# Patient Record
Sex: Male | Born: 1945 | Race: Asian | Hispanic: No | Marital: Married | Smoking: Former smoker
Health system: Southern US, Community
[De-identification: ages and names within clinical notes are randomized; demographics above are authoritative.]

## PROBLEM LIST (undated history)

## (undated) DIAGNOSIS — R4182 Altered mental status, unspecified: Secondary | ICD-10-CM

## (undated) DIAGNOSIS — J810 Acute pulmonary edema: Secondary | ICD-10-CM

## (undated) HISTORY — DX: Altered mental status, unspecified: R41.82

## (undated) HISTORY — DX: Acute pulmonary edema: J81.0

---

## 1998-12-07 ENCOUNTER — Observation Stay (HOSPITAL_COMMUNITY): Admission: EM | Admit: 1998-12-07 | Discharge: 1998-12-08 | Payer: Self-pay | Admitting: Emergency Medicine

## 1998-12-07 ENCOUNTER — Encounter: Payer: Self-pay | Admitting: *Deleted

## 2009-01-24 ENCOUNTER — Emergency Department (HOSPITAL_COMMUNITY): Admission: EM | Admit: 2009-01-24 | Discharge: 2009-01-24 | Payer: Self-pay | Admitting: Family Medicine

## 2010-07-29 LAB — CBC
HCT: 36.1 % — ABNORMAL LOW (ref 39.0–52.0)
Hemoglobin: 11.9 g/dL — ABNORMAL LOW (ref 13.0–17.0)
MCHC: 33.1 g/dL (ref 30.0–36.0)
MCV: 80.3 fL (ref 78.0–100.0)
Platelets: 175 10*3/uL (ref 150–400)
RBC: 4.49 MIL/uL (ref 4.22–5.81)
RDW: 13.7 % (ref 11.5–15.5)
WBC: 8.8 10*3/uL (ref 4.0–10.5)

## 2010-07-29 LAB — POCT I-STAT, CHEM 8
Calcium, Ion: 1.09 mmol/L — ABNORMAL LOW (ref 1.12–1.32)
HCT: 39 % (ref 39.0–52.0)
TCO2: 25 mmol/L (ref 0–100)

## 2010-07-29 LAB — DIFFERENTIAL
Basophils Absolute: 0 10*3/uL (ref 0.0–0.1)
Basophils Relative: 0 % (ref 0–1)
Eosinophils Absolute: 0.1 10*3/uL (ref 0.0–0.7)
Eosinophils Relative: 1 % (ref 0–5)
Lymphocytes Relative: 14 % (ref 12–46)
Lymphs Abs: 1.2 10*3/uL (ref 0.7–4.0)
Monocytes Absolute: 0.8 10*3/uL (ref 0.1–1.0)
Monocytes Relative: 9 % (ref 3–12)
Neutro Abs: 6.6 10*3/uL (ref 1.7–7.7)
Neutrophils Relative %: 75 % (ref 43–77)

## 2010-07-29 LAB — POCT URINALYSIS DIP (DEVICE)
Nitrite: NEGATIVE
Protein, ur: NEGATIVE mg/dL
pH: 5 (ref 5.0–8.0)

## 2011-08-31 ENCOUNTER — Emergency Department (INDEPENDENT_AMBULATORY_CARE_PROVIDER_SITE_OTHER)
Admission: EM | Admit: 2011-08-31 | Discharge: 2011-08-31 | Disposition: A | Discharge status: Other Acute Inpatient Hospital | Payer: Commercial Managed Care - PPO | Source: Home / Self Care | Attending: Emergency Medicine | Admitting: Emergency Medicine

## 2011-08-31 ENCOUNTER — Encounter (HOSPITAL_COMMUNITY): Payer: Self-pay | Admitting: *Deleted

## 2011-08-31 ENCOUNTER — Encounter (HOSPITAL_COMMUNITY): Payer: Self-pay

## 2011-08-31 ENCOUNTER — Emergency Department (INDEPENDENT_AMBULATORY_CARE_PROVIDER_SITE_OTHER): Payer: Commercial Managed Care - PPO

## 2011-08-31 ENCOUNTER — Emergency Department (HOSPITAL_COMMUNITY): Payer: Commercial Managed Care - PPO

## 2011-08-31 ENCOUNTER — Emergency Department (HOSPITAL_COMMUNITY)
Admission: EM | Admit: 2011-08-31 | Discharge: 2011-08-31 | Disposition: A | Discharge status: Home / Self Care | Payer: Commercial Managed Care - PPO | Attending: Emergency Medicine | Admitting: Emergency Medicine

## 2011-08-31 DIAGNOSIS — R918 Other nonspecific abnormal finding of lung field: Secondary | ICD-10-CM

## 2011-08-31 DIAGNOSIS — R911 Solitary pulmonary nodule: Secondary | ICD-10-CM

## 2011-08-31 DIAGNOSIS — J449 Chronic obstructive pulmonary disease, unspecified: Secondary | ICD-10-CM | POA: Insufficient documentation

## 2011-08-31 DIAGNOSIS — R222 Localized swelling, mass and lump, trunk: Secondary | ICD-10-CM

## 2011-08-31 DIAGNOSIS — M545 Low back pain, unspecified: Secondary | ICD-10-CM | POA: Insufficient documentation

## 2011-08-31 DIAGNOSIS — J4489 Other specified chronic obstructive pulmonary disease: Secondary | ICD-10-CM | POA: Insufficient documentation

## 2011-08-31 DIAGNOSIS — J984 Other disorders of lung: Secondary | ICD-10-CM | POA: Insufficient documentation

## 2011-08-31 DIAGNOSIS — F17200 Nicotine dependence, unspecified, uncomplicated: Secondary | ICD-10-CM | POA: Insufficient documentation

## 2011-08-31 LAB — URINALYSIS, ROUTINE W REFLEX MICROSCOPIC
Bilirubin Urine: NEGATIVE
Glucose, UA: NEGATIVE mg/dL
Hgb urine dipstick: NEGATIVE
Specific Gravity, Urine: 1.02 (ref 1.005–1.030)
pH: 5.5 (ref 5.0–8.0)

## 2011-08-31 LAB — DIFFERENTIAL
Basophils Absolute: 0 10*3/uL (ref 0.0–0.1)
Basophils Relative: 0 % (ref 0–1)
Eosinophils Relative: 2 % (ref 0–5)
Lymphocytes Relative: 19 % (ref 12–46)
Neutro Abs: 5.6 10*3/uL (ref 1.7–7.7)

## 2011-08-31 LAB — COMPREHENSIVE METABOLIC PANEL
ALT: 17 U/L (ref 0–53)
AST: 22 U/L (ref 0–37)
Albumin: 3.5 g/dL (ref 3.5–5.2)
CO2: 23 mEq/L (ref 19–32)
Calcium: 9 mg/dL (ref 8.4–10.5)
GFR calc non Af Amer: >90 mL/min (ref >90)
Sodium: 137 mEq/L (ref 135–145)
Total Protein: 7.1 g/dL (ref 6.0–8.3)

## 2011-08-31 LAB — CBC
MCHC: 32.8 g/dL (ref 30.0–36.0)
MCV: 76 fL — ABNORMAL LOW (ref 78.0–100.0)
Platelets: 153 10*3/uL (ref 150–400)
RDW: 13 % (ref 11.5–15.5)
WBC: 8 10*3/uL (ref 4.0–10.5)

## 2011-08-31 MED ORDER — AZITHROMYCIN 250 MG PO TABS: 250.0000 mg | ORAL_TABLET | Freq: Every day | ORAL | Status: AC

## 2011-08-31 MED ORDER — HYDROCODONE-ACETAMINOPHEN 5-325 MG PO TABS: ORAL_TABLET | ORAL | Status: AC

## 2011-08-31 MED ORDER — HYDROCODONE-ACETAMINOPHEN 5-325 MG PO TABS
1.0000 | ORAL_TABLET | Freq: Once | ORAL | Status: AC
  Administered 2011-08-31: 1 via ORAL
  Filled 2011-08-31: qty 1

## 2011-08-31 MED ORDER — IOHEXOL 300 MG/ML  SOLN
80.0000 mL | Freq: Once | INTRAMUSCULAR | Status: AC | PRN
  Administered 2011-08-31: 80 mL via INTRAVENOUS

## 2011-08-31 MED ORDER — AZITHROMYCIN 250 MG PO TABS
500.0000 mg | ORAL_TABLET | Freq: Once | ORAL | Status: AC
  Administered 2011-08-31: 500 mg via ORAL
  Filled 2011-08-31: qty 2

## 2011-08-31 NOTE — Discharge Instructions (Signed)
Please read and follow all provided instructions.  Your diagnoses today include:  1. Lung nodule     Tests performed today include:  Blood counts and electrolytes - normal  CT scan of lungs - shows a nodule that will need follow up with a lung doctor  Vital signs. See below for your results today.   Medications prescribed:   Vicodin (hydrocodone/acetaminophen) - narcotic pain medication  You have been prescribed narcotic pain medication such as Vicodin or Percocet: DO NOT drive or perform any activities that require you to be awake and alert because this medicine can make you drowsy. BE VERY CAREFUL not to take multiple medicines containing Tylenol (also called acetaminophen). Doing so can lead to an overdose which can damage your liver and cause liver failure and possibly death.    Zithromax - antibiotic that treats pneumonia  You have been prescribed an antibiotic medicine: take the entire course of medicine even if you are feeling better. Stopping early can cause the antibiotic not to work.  Take any prescribed medications only as directed.  Home care instructions:  Follow any educational materials contained in this packet.  BE VERY CAREFUL not to take multiple medicines containing Tylenol (also called acetaminophen). Doing so can lead to an overdose which can damage your liver and cause liver failure and possibly death.   Follow-up instructions: Call lung doctor tomorrow to schedule an appointment. Tell them that you were in the Emergency Department, you had a CT scan performed and need a follow-up appointment.   Please follow-up with your primary care provider in the next 3 days for further evaluation of your symptoms.   If you do not have a primary care doctor -- see below for referral information.   Return instructions:   Please return to the Emergency Department if you experience worsening symptoms.   Please return if you have any other emergent  concerns.  Additional Information:  Your vital signs today were: BP 127/68  Pulse 73  Temp(Src) 97.8 F (36.6 C) (Oral)  Resp 18  SpO2 100% If your blood pressure (BP) was elevated above 135/85 this visit, please have this repeated by your doctor within one month. -------------- No Primary Care Doctor Call [facility]  [phone] Other agencies that provide inexpensive medical care    [facility]  [phone]    [facility]  [phone]    [facility]  [phone]    [facility]  [phone]    [facility]  [phone]    [facility]  [phone] -------------- RESOURCE GUIDE:  Dental Problems  Patients with Medicaid: [facility] [phone] [address]                                            [phone] [address] Phone:  [phone]                                                   Phone:  [phone]  If unable to pay or uninsured, contact:  [facility] or [facility]. to become  qualified for the adult dental clinic.  Chronic Pain Problems Contact [facility]  [phone] Patients need to be referred by their primary care doctor.  Insufficient Money for Medicine Contact [employer]:  call "211" or [facility] [phone].  Psychological Services [facility]  [phone] [facility]  [phone] [facility]   [phone] (emergency services [phone])  Substance Abuse Resources [facility]  [phone] [facility] [phone] [facility] [place] [phone] Floydene Flock [phone] Residential & Outpatient Substance Abuse Program  [phone]  Abuse/Neglect [facility] Child Abuse Hotline [phone] [facility] Child Abuse Hotline [phone] (After Hours)  Emergency Shelter [facility] [phone]  Maternity Homes [facility] [place] [facility]  [facility] [place] [phone] [place] [facility] [phone]  [facility]  [facility]     [employer]                          [facility] [address] [place]                       [address]      [address]  Blondell Reveal Phone:  [phone]                                   Phone:  [phone]                 Phone:  [phone]  [facility] Mental [facility] Phone:  [phone]  [facility] Child Abuse Hotline 904 [phone] (After Hours)

## 2011-08-31 NOTE — ED Provider Notes (Signed)
History     CSN: [id number]  Arrival date & time 08/31/11  1414   First MD Initiated Contact with Patient 08/31/11 1607      Chief Complaint  Patient presents with  . Cough    (Consider location/radiation/quality/duration/timing/severity/associated sxs/prior treatment) HPI Comments: Patient with likely history of COPD and multiple decade smoking history presents from [facility] urgent care center for further evaluation of cough and suspicious nodule found on chest x-ray. History is obtained with help of granddaughter who interprets. Patient speaks limited [place]. Patient has had cough for 3-4 days that is nonproductive. Also complains of bilateral lower back pain for 2 weeks. Patient denies fevers, chills, upper respiratory tract infection symptoms, chest pain, abdominal pain, urinary symptoms.  Patient is a 66 y.o. male presenting with cough. The history is provided by the patient.  Cough This is a new problem. The current episode started more than 2 days ago. The problem has not changed since onset.The cough is non-productive. There has been no fever. Pertinent negatives include no chest pain, no chills, no sweats, no weight loss, no headaches, no rhinorrhea, no sore throat, no myalgias, no shortness of breath, no wheezing and no eye redness. He has tried nothing for the symptoms. He is a smoker. His past medical history is significant for COPD.    History reviewed. No pertinent past medical history.  History reviewed. No pertinent past surgical history.  History reviewed. No pertinent family history.  History  Substance Use Topics  . Smoking status: Current Everyday Smoker  . Smokeless tobacco: Not on file  . Alcohol Use: Yes      Review of Systems  Constitutional: Negative for fever, chills and weight loss.  HENT: Negative for sore throat and rhinorrhea.   Eyes: Negative for redness.  Respiratory: Positive for cough. Negative for shortness of breath and wheezing.     Cardiovascular: Negative for chest pain.  Gastrointestinal: Negative for nausea, vomiting, abdominal pain and diarrhea.  Genitourinary: Negative for dysuria.  Musculoskeletal: Positive for back pain. Negative for myalgias.  Skin: Negative for rash.  Neurological: Negative for headaches.    Allergies  Review of patient's allergies indicates no known allergies.  Home Medications   Current Outpatient Rx  Name Route Sig Dispense Refill  . ACETAMINOPHEN 325 MG PO TABS Oral Take 650 mg by mouth every 6 (six) hours as needed.      BP 131/73  Pulse 78  Temp(Src) 97.8 F (36.6 C) (Oral)  Resp 18  SpO2 99%  Physical Exam  Nursing note and vitals reviewed. Constitutional: He appears well-developed. He appears cachectic.  HENT:  Head: Normocephalic and atraumatic.  Eyes: Conjunctivae are normal. Right eye exhibits no discharge. Left eye exhibits no discharge.  Neck: Normal range of motion. Neck supple.  Cardiovascular: Normal rate, regular rhythm and normal heart sounds.   No murmur heard. Pulmonary/Chest: Effort normal and breath sounds normal. No respiratory distress. He has no wheezes.  Abdominal: Soft. There is no tenderness.  Musculoskeletal: He exhibits no edema and no tenderness.       Cervical back: He exhibits normal range of motion, no tenderness and no bony tenderness.       Thoracic back: He exhibits normal range of motion, no tenderness and no bony tenderness.       Lumbar back: He exhibits normal range of motion, no tenderness and no bony tenderness.  Neurological: He is alert. He has normal strength. Coordination normal.       5/5 strength in  lower extremities  Skin: Skin is warm and dry.  Psychiatric: He has a normal mood and affect.    ED Course  Procedures (including critical care time)  Labs Reviewed  CBC - Abnormal; Notable for the following:    Hemoglobin 12.8 (*)    MCV 76.0 (*)    MCH 25.0 (*)    All other components within normal limits   URINALYSIS, ROUTINE W REFLEX MICROSCOPIC - Abnormal; Notable for the following:    Ketones, ur 15 (*)    All other components within normal limits  DIFFERENTIAL  COMPREHENSIVE METABOLIC PANEL   Dg Chest 2 View  08/31/2011  *RADIOLOGY REPORT*  Clinical Data: Cough  CHEST - 2 VIEW  Comparison: None.  Findings: The lungs are very hyperaerated consistent with severe COPD.  There is a nodular lesion at the left lung base of approximately 3.4 cm worrisome for primary lung malignancy.  Small nodular opacities also are noted in the right mid lung which could represent metastatic lesions.  CT of the chest therefore with IV contrast media is recommended to assess further.  Pleural scarring and/or plaque formation is noted at the right lung base laterally. Mediastinal contours are unremarkable.  The heart is within normal limits in size.  No bony abnormality is seen.  IMPRESSION:  1.  3.4 cm mass-like opacity at the left lung base worrisome for malignancy.  Recommend CT of the chest with IV contrast media. 2.  Small nodular opacities in the right mid lung could represent metastatic lesions. 3.  COPD.  Original Report Authenticated By: Juline Patch, M.D.   Dg Lumbar Spine Complete  08/31/2011  *RADIOLOGY REPORT*  Clinical Data: Cough, low back pain.  LUMBAR SPINE - COMPLETE 4+ VIEW  Comparison: None.  Findings: Disc spaces are maintained.  Mild lateral and anterior spurring.  Normal alignment.  Normal bone mineralization.  No fracture.  SI joints are symmetric and unremarkable.  IMPRESSION: No acute findings.  Original Report Authenticated By: Cyndie Chime, M.D.   Ct Chest W Contrast  08/31/2011  *RADIOLOGY REPORT*  Clinical Data: Cough.  Lung cancer.  Abnormal chest radiograph.  CT CHEST WITH CONTRAST  Technique:  Multidetector CT imaging of the chest was performed following the standard protocol during bolus administration of intravenous contrast.  Contrast: 80mL OMNIPAQUE IOHEXOL 300 MG/ML  SOLN  Comparison:  08/31/2011.  Findings: Severe emphysema is present.  There is no axillary adenopathy.  Coronary artery atherosclerosis.  No pericardial effusion.  Calcified pleural plaques are present in the right lung. Scattered calcified granuloma are present in the right lung. Largest measures 7 mm (image 31 series 3).  Soft tissue density nodules are present in the lateral left lower lobe over the hemidiaphragm near the costophrenic angle.  The largest lesion measures 28 mm AP by 17 mm transverse (image 48 series 3).  Smaller lesion just anterior measures 18 mm AP by 12 mm transverse. On the coronal reconstructed images, these nodules appear to follow the orientation of the bronchovascular bundles, suggesting this may relate to chronic mucoid impaction.  Small areas spiculation are present in the left lung (image 31 series 3).  Many of the other areas away from the nodules appear compatible with scarring.  Incidental visualization of the upper abdomen is within normal limits.  No mediastinal or hilar adenopathy.  No aggressive osseous lesions are identified.  IMPRESSION: 1.  The mass in the left base on prior radiograph corresponds with nodules in the lateral left lower lobe.  The dominant nodule measures 28 mm AP by 17 mm transverse.  Given the underlying pulmonary parenchymal disease, these may represent inflammatory nodules or areas of mucoid impaction however underlying neoplasm/mass lesion is not excluded.  Follow-up PET CT should be considered. 2.  Scattered calcified granulomata. 3.  Severe emphysema. 4. Multiple scattered areas of pulmonary parenchymal scarring. 5.  Atherosclerosis and coronary artery disease. 6.  Calcified right pleural plaque. 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  Old infection is a consideration as well.  Original Report Authenticated By: Andreas Newport, M.D.     1. Lung nodule   2. Lower back pain     4:16 PM Patient seen and examined. Work-up initiated. Previous  findings and chart reviewed.   Vital signs reviewed and are as follows: Filed Vitals:   08/31/11 1427  BP: 131/73  Pulse: 78  Temp: 97.8 F (36.6 C)  Resp: 18   7:00 PM Patient going to CT now.   8:59 PM CT scan reviewed by myself. Discussed with Dr. Estell Harpin. Discussed findings at length with patient and family. Patient and family state they will followup as directed with the lung specialist referral provided. Family assures me that there is no problem doing this and they will call tomorrow.   Urged patient to return with worsening symptoms, shortness of breath, coughing up blood, fever.   Also urged to establish care with primary care physician.  Patient counseled on use of narcotic pain medications. Counseled not to combine these medications with others containing tylenol. Urged not to drink alcohol, drive, or perform any other activities that requires focus while taking these medications. The patient verbalizes understanding and agrees with the plan.  MDM  Lung nodule -- CT scan favors inflammatory process over cancer however cannot exclude cancer. Pulmonary followup will be needed. Patient given appropriate referral. Will treat with pain medicine and antibiotics. Family is reliable. Back pain possibly related to lung findings. Will benefit from PCP follow-up.       Renne Crigler, [place] 08/31/11 2107

## 2011-08-31 NOTE — ED Notes (Signed)
Patient here for evaluation of cough for past 3-4 days; NAD at present; per grandaughter , patient has not been having a productive cough

## 2011-08-31 NOTE — Discharge Instructions (Signed)
TO ED now

## 2011-08-31 NOTE — ED Provider Notes (Signed)
History     CSN: [id number]  Arrival date & time 08/31/11  1130   First MD Initiated Contact with Patient 08/31/11 1227      Chief Complaint  Patient presents with  . Cough    (Consider location/radiation/quality/duration/timing/severity/associated sxs/prior treatment) Patient is a 66 y.o. male presenting with cough. The history is provided by the patient. No language interpreter was used.  Cough This is a new problem. The current episode started more than 1 week ago. The problem occurs constantly. The problem has been gradually worsening. The cough is productive of sputum. There has been no fever. Associated symptoms include myalgias. Pertinent negatives include no shortness of breath. He has tried nothing for the symptoms. He is a smoker.  Pt complains of  A cough and severe pain in his low back.   No past history.  No MD.  Pt reports he has been unable to work due to the pain  History reviewed. No pertinent past medical history.  History reviewed. No pertinent past surgical history.  History reviewed. No pertinent family history.  History  Substance Use Topics  . Smoking status: Current Everyday Smoker  . Smokeless tobacco: Not on file  . Alcohol Use: Yes      Review of Systems  Respiratory: Positive for cough. Negative for shortness of breath.   Musculoskeletal: Positive for myalgias.  All other systems reviewed and are negative.    Allergies  Review of patient's allergies indicates no known allergies.  Home Medications  No current outpatient prescriptions on file.  BP 124/78  Pulse 88  Temp(Src) 98.9 F (37.2 C) (Oral)  Resp 20  SpO2 99%  Physical Exam  Vitals reviewed. Constitutional: He appears well-developed and well-nourished.  HENT:  Head: Normocephalic.  Right Ear: External ear normal.  Eyes: Pupils are equal, round, and reactive to light.  Neck: Normal range of motion.  Cardiovascular: Normal rate and normal heart sounds.   Pulmonary/Chest:  He exhibits no tenderness.       Rhonchi,   Abdominal: Soft.  Musculoskeletal: He exhibits tenderness.       Tender LS spine diffusely  Neurological: He is alert.    ED Course  Procedures (including critical care time)  Labs Reviewed - No data to display Dg Chest 2 View  08/31/2011  *RADIOLOGY REPORT*  Clinical Data: Cough  CHEST - 2 VIEW  Comparison: None.  Findings: The lungs are very hyperaerated consistent with severe COPD.  There is a nodular lesion at the left lung base of approximately 3.4 cm worrisome for primary lung malignancy.  Small nodular opacities also are noted in the right mid lung which could represent metastatic lesions.  CT of the chest therefore with IV contrast media is recommended to assess further.  Pleural scarring and/or plaque formation is noted at the right lung base laterally. Mediastinal contours are unremarkable.  The heart is within normal limits in size.  No bony abnormality is seen.  IMPRESSION:  1.  3.4 cm mass-like opacity at the left lung base worrisome for malignancy.  Recommend CT of the chest with IV contrast media. 2.  Small nodular opacities in the right mid lung could represent metastatic lesions. 3.  COPD.  Original Report Authenticated By: Juline Patch, M.D.   Dg Lumbar Spine Complete  08/31/2011  *RADIOLOGY REPORT*  Clinical Data: Cough, low back pain.  LUMBAR SPINE - COMPLETE 4+ VIEW  Comparison: None.  Findings: Disc spaces are maintained.  Mild lateral and anterior spurring.  Normal  alignment.  Normal bone mineralization.  No fracture.  SI joints are symmetric and unremarkable.  IMPRESSION: No acute findings.  Original Report Authenticated By: Cyndie Chime, M.D.     No diagnosis found.    MDM     Pt to ED for further evaluation and Ct scan     Elson Areas, [place] 08/31/11 1352

## 2011-08-31 NOTE — ED Notes (Signed)
Pt sent here from [facility] for cough x 3-4 days, has nodule on chest xray, sent here for ct scan. No acute distress noted at triage. Airway intact.

## 2011-08-31 NOTE — ED Provider Notes (Signed)
Medical screening examination/treatment/procedure(s) were performed by non-physician practitioner and as supervising physician I was immediately available for consultation/collaboration.  Leslee Home, M.D.   Reuben Likes, MD 08/31/11 2240

## 2011-09-01 NOTE — ED Provider Notes (Signed)
Medical screening examination/treatment/procedure(s) were performed by non-physician practitioner and as supervising physician I was immediately available for consultation/collaboration.   Janece Laidlaw L Shenouda Genova, MD 09/01/11 1536 

## 2011-09-02 ENCOUNTER — Ambulatory Visit (INDEPENDENT_AMBULATORY_CARE_PROVIDER_SITE_OTHER): Payer: Commercial Managed Care - PPO | Admitting: Internal Medicine

## 2011-09-02 ENCOUNTER — Encounter: Payer: Self-pay | Admitting: Internal Medicine

## 2011-09-02 VITALS — BP 100/60 | HR 78 | Temp 98.1°F | Ht 63.0 in | Wt 91.4 lb

## 2011-09-02 DIAGNOSIS — J449 Chronic obstructive pulmonary disease, unspecified: Secondary | ICD-10-CM

## 2011-09-02 DIAGNOSIS — R918 Other nonspecific abnormal finding of lung field: Secondary | ICD-10-CM

## 2011-09-02 DIAGNOSIS — R9389 Abnormal findings on diagnostic imaging of other specified body structures: Secondary | ICD-10-CM

## 2011-09-02 DIAGNOSIS — J069 Acute upper respiratory infection, unspecified: Secondary | ICD-10-CM

## 2011-09-02 MED ORDER — PREDNISONE (PAK) 10 MG PO TABS: ORAL_TABLET | ORAL | Status: AC

## 2011-09-02 MED ORDER — FAMOTIDINE 20 MG PO TABS: ORAL_TABLET | ORAL | Status: DC

## 2011-09-02 NOTE — Progress Notes (Signed)
  Subjective:    Patient ID: John Luna, male    DOB: April 23, 1946  MRN: [id number]  HPI  66 yo [place] male smoker referred by [facility] for abn cxr - came with family member who speaks English but has a great deal of difficulty understanding him.   09/02/2011 1st pulmonary eval never sees a doctor  Cc sick  one week sorethroat,  New ha's, dysphagia, cough something but doesn't know what rx with zmax. Has assoc new dysphagia but swallowing solids ok, just poor appetite. No def fever or chills/ myalgias n or v or d  Sleeping ok without nocturnal  or early am exacerbation  of respiratory  c/o's or need for noct saba. Also denies any obvious fluctuation of symptoms with weather or environmental changes or other aggravating or alleviating factors except as outlined above   Review of Systems  Constitutional: Positive for appetite change and unexpected weight change. Negative for fever, chills and activity change.  HENT: Positive for sore throat and trouble swallowing. Negative for congestion, rhinorrhea, sneezing, dental problem, voice change and postnasal drip.   Eyes: Negative for visual disturbance.  Respiratory: Positive for cough. Negative for choking and shortness of breath.   Cardiovascular: Negative for chest pain and leg swelling.  Gastrointestinal: Negative for nausea, vomiting and abdominal pain.  Genitourinary: Negative for difficulty urinating.  Musculoskeletal: Negative for arthralgias.  Skin: Negative for rash.  Psychiatric/Behavioral: Negative for behavioral problems and confusion.       Objective:   Physical Exam Chronically ill thin bm nad Wt Readings from Last 3 Encounters:  09/02/11 91 lb 6.4 oz (41.459 kg)    HEENT mild turbinate edema.  Oropharynx no thrush or excess pnd or cobblestoning.  No JVD or cervical adenopathy. Mild accessory muscle hypertrophy. Trachea midline, nl thryroid. Chest was hyperinflated by percussion with diminished breath sounds and moderate  increased exp time without wheeze. Hoover sign positive at mid inspiration. Regular rate and rhythm without murmur gallop or rub or increase P2 or edema.  Abd: no hsm, nl excursion. Ext warm without cyanosis or clubbing.    CT 08/31/11 The mass in the left base on prior radiograph corresponds with  nodules in the lateral left lower lobe. The dominant nodule  measures 28 mm AP by 17 mm transverse. Given the underlying  pulmonary parenchymal disease, these may represent inflammatory  nodules or areas of mucoid impaction however underlying  neoplasm/mass lesion is not excluded. Follow-up PET CT should be  considered.  2. Scattered calcified granulomata.  3. Severe emphysema.  4. Multiple scattered areas of pulmonary parenchymal scarring.  5. Atherosclerosis and coronary artery disease.  6. Calcified right pleural plaque.   08/31/11 labs reviewed and all nl including wbc     Assessment & Plan:

## 2011-09-02 NOTE — Patient Instructions (Addendum)
Pepcid 20 mg after bfast and at bedtime and use lifesavers or jolly ranchers or ice chips - no medicated lozenges  Prednisone 10 mg take  4 each am x 2 days,   2 each am x 2 days,  1 each am x2days and stop   Please schedule a follow up office visit in 2 weeks with cxr, if worsen in meantime go back to ER  [employer] with you and all active medications

## 2011-09-03 DIAGNOSIS — J069 Acute upper respiratory infection, unspecified: Secondary | ICD-10-CM | POA: Insufficient documentation

## 2011-09-03 DIAGNOSIS — J449 Chronic obstructive pulmonary disease, unspecified: Secondary | ICD-10-CM | POA: Insufficient documentation

## 2011-09-03 DIAGNOSIS — R9389 Abnormal findings on diagnostic imaging of other specified body structures: Secondary | ICD-10-CM | POA: Insufficient documentation

## 2011-09-03 NOTE — Assessment & Plan Note (Signed)
rx by ER with Zmax 5/8 > since positive rhonhci rec add 6 days of prednisone

## 2011-09-03 NOTE — Assessment & Plan Note (Signed)
As best I could I reviewed with pt and fm member the harms of smoking and rec stop now.  Doubt inhalers will be useful here if he continues to smoke  Prednisone 10 mg take  4 each am x 2 days,   2 each am x 2 days,  1 each am x2days and stop

## 2011-09-03 NOTE — Assessment & Plan Note (Addendum)
No comparison cxr's avail but there's nothing on the ct chest to suggest this is acute and based on the multiple location this is either longstanding/ benign or late stage metastatic dz with no benefit to an early tissue dx.  Will follow conservatively for now and focus on getting the pt to quit smoking if at all possible

## 2011-09-16 ENCOUNTER — Ambulatory Visit: Payer: Commercial Managed Care - PPO | Admitting: Internal Medicine

## 2011-09-16 ENCOUNTER — Other Ambulatory Visit: Payer: Self-pay | Admitting: Internal Medicine

## 2011-09-16 DIAGNOSIS — R911 Solitary pulmonary nodule: Secondary | ICD-10-CM

## 2011-09-20 ENCOUNTER — Ambulatory Visit (INDEPENDENT_AMBULATORY_CARE_PROVIDER_SITE_OTHER): Payer: Commercial Managed Care - PPO | Admitting: Internal Medicine

## 2011-09-20 ENCOUNTER — Ambulatory Visit (INDEPENDENT_AMBULATORY_CARE_PROVIDER_SITE_OTHER)
Admission: RE | Admit: 2011-09-20 | Discharge: 2011-09-20 | Disposition: A | Discharge status: Home / Self Care | Payer: Commercial Managed Care - PPO | Source: Ambulatory Visit | Attending: Internal Medicine | Admitting: Internal Medicine

## 2011-09-20 ENCOUNTER — Encounter: Payer: Self-pay | Admitting: Internal Medicine

## 2011-09-20 VITALS — BP 100/60 | HR 70 | Temp 97.8°F | Ht 62.0 in | Wt 90.2 lb

## 2011-09-20 DIAGNOSIS — R918 Other nonspecific abnormal finding of lung field: Secondary | ICD-10-CM

## 2011-09-20 DIAGNOSIS — R911 Solitary pulmonary nodule: Secondary | ICD-10-CM

## 2011-09-20 DIAGNOSIS — R9389 Abnormal findings on diagnostic imaging of other specified body structures: Secondary | ICD-10-CM

## 2011-09-20 NOTE — Patient Instructions (Signed)
Maintain off cigarettes if at all possible  For cough ok to take mucinex as needed (available over the counter)  Please schedule a follow up office visit in 6 weeks, call sooner if needed with cxr.

## 2011-09-20 NOTE — Progress Notes (Signed)
Subjective:    Patient ID: John Luna, male    DOB: 1945-10-14  MRN: [id number]  HPI  66 yo viet[place] male smoker in [place] since 1986  referred by [facility] for abn cxr.  09/02/2011 1st pulmonary eval never sees a doctor  Cc sick  one week sorethroat,  New ha's, dysphagia, cough something but doesn't know what rx with zmax. Has assoc new dysphagia but swallowing solids ok, just poor appetite. No def fever or chills/ myalgias n or v or d rec Pepcid 20 mg after bfast and at bedtime and use lifesavers or jolly ranchers or ice chips - no medicated lozenges Prednisone 10 mg take  4 each am x 2 days,   2 each am x 2 days,  1 each am x2days and stop  Please schedule a follow up office visit in 2 weeks with cxr, if worsen in meantime go back to ER  Bring John Luna with you and all active medications  09/20/2011 f/u ov/John Luna  With son John Luna cc all acute complaints resolved, no sorethroat, no cough, not clear whether he really followed any of the above instructions. No limiting sob.  Sleeping ok without nocturnal  or early am exacerbation  of respiratory  c/o's or need for noct saba. Also denies any obvious fluctuation of symptoms with weather or environmental changes or other aggravating or alleviating factors except as outlined above.  ROS  At present neg for  any significant sore throat, dysphagia, dental problems, itching, sneezing,  nasal congestion or excess/ purulent secretions, ear ache,   fever, chills, sweats, unintended wt loss, pleuritic or exertional cp, hemoptysis, palpitations, orthopnea pnd or leg swelling.  Also denies presyncope, palpitations, heartburn, abdominal pain, anorexia, nausea, vomiting, diarrhea  or change in bowel or urinary habits, change in stools or urine, dysuria,hematuria,  rash, arthralgias, visual complaints, headache, numbness weakness or ataxia or problems with walking or coordination. No noted change in mood/affect or memory.              Objective:   Physical  Exam Chronically ill thin bm nad - wt 09/20/2011  90 Wt Readings from Last 3 Encounters:  09/02/11 91 lb 6.4 oz (41.459 kg)    HEENT mild turbinate edema.  Oropharynx no thrush or excess pnd or cobblestoning.  No JVD or cervical adenopathy. Mild accessory muscle hypertrophy. Trachea midline, nl thryroid. Chest was hyperinflated by percussion with diminished breath sounds and moderate increased exp time without wheeze. Hoover sign positive at mid inspiration. Regular rate and rhythm without murmur gallop or rub or increase P2 or edema.  Abd: no hsm, nl excursion. Ext warm without cyanosis or def clubbing.    CT 08/31/11 The mass in the left base on prior radiograph corresponds with  nodules in the lateral left lower lobe. The dominant nodule  measures 28 mm AP by 17 mm transverse. Given the underlying  pulmonary parenchymal disease, these may represent inflammatory  nodules or areas of mucoid impaction however underlying  neoplasm/mass lesion is not excluded. Follow-up PET CT should be  considered.  2. Scattered calcified granulomata.  3. Severe emphysema.  4. Multiple scattered areas of pulmonary parenchymal scarring.  5. Atherosclerosis and coronary artery disease.  6. Calcified right pleural plaque.   CXR  09/20/2011 :  1. Previously noted mass-like opacity in the left lower lobe is larger but less well defined on today's examination. This is again favored to be an infectious process, but underlying neoplasm would be difficult to entirely exclude. Clinical correlation  is recommended. 2. Right-sided calcified pleural plaques again noted, likely from prior right-sided hemorrhage or infection. 3. Multiple tiny nodules in the right mid lung similar to prior study. Continued attention on future follow up examinations is recommended.       Assessment & Plan:

## 2011-09-23 NOTE — Assessment & Plan Note (Signed)
Clinically doing fine now c/w "cxr lag" in active smoker > def needs f/u cxr, reviewed with son John Luna due to no baseline cxr avail and huge language barrier important to return here for f/u - in meantime make every effort to stop smoking.  No further rx other than to treat the symptoms of cough/ congestion with mucinex.

## 2011-11-02 ENCOUNTER — Encounter: Payer: Commercial Managed Care - PPO | Admitting: Internal Medicine

## 2011-11-02 NOTE — Progress Notes (Signed)
 This encounter was created in error - please disregard.

## 2012-06-16 ENCOUNTER — Encounter (HOSPITAL_COMMUNITY): Payer: Self-pay | Admitting: Emergency Medicine

## 2012-06-16 ENCOUNTER — Emergency Department (HOSPITAL_COMMUNITY)
Admission: EM | Admit: 2012-06-16 | Discharge: 2012-06-16 | Disposition: A | Discharge status: Home / Self Care | Payer: Commercial Managed Care - PPO | Attending: Emergency Medicine | Admitting: Emergency Medicine

## 2012-06-16 DIAGNOSIS — A088 Other specified intestinal infections: Secondary | ICD-10-CM | POA: Insufficient documentation

## 2012-06-16 DIAGNOSIS — Z79899 Other long term (current) drug therapy: Secondary | ICD-10-CM | POA: Insufficient documentation

## 2012-06-16 DIAGNOSIS — R112 Nausea with vomiting, unspecified: Secondary | ICD-10-CM | POA: Insufficient documentation

## 2012-06-16 DIAGNOSIS — E860 Dehydration: Secondary | ICD-10-CM | POA: Insufficient documentation

## 2012-06-16 DIAGNOSIS — Z87891 Personal history of nicotine dependence: Secondary | ICD-10-CM | POA: Insufficient documentation

## 2012-06-16 DIAGNOSIS — R197 Diarrhea, unspecified: Secondary | ICD-10-CM | POA: Insufficient documentation

## 2012-06-16 LAB — COMPREHENSIVE METABOLIC PANEL
Albumin: 4.2 g/dL (ref 3.5–5.2)
BUN: 21 mg/dL (ref 6–23)
Calcium: 9.8 mg/dL (ref 8.4–10.5)
GFR calc Af Amer: >90 mL/min (ref >90)
Glucose, Bld: 92 mg/dL (ref 70–99)
Total Protein: 8.1 g/dL (ref 6.0–8.3)

## 2012-06-16 LAB — CBC WITH DIFFERENTIAL/PLATELET
Basophils Relative: 0 % (ref 0–1)
Eosinophils Absolute: 0.3 10*3/uL (ref 0.0–0.7)
Eosinophils Absolute: 0.2 10*3/uL (ref 0.0–0.7)
Eosinophils Relative: 3 % (ref 0–5)
Eosinophils Relative: 3 % (ref 0–5)
HCT: 46.9 % (ref 39.0–52.0)
Hemoglobin: 15.4 g/dL (ref 13.0–17.0)
Hemoglobin: 15.1 g/dL (ref 13.0–17.0)
Lymphocytes Relative: 10 % — ABNORMAL LOW (ref 12–46)
Lymphs Abs: 0.9 10*3/uL (ref 0.7–4.0)
Lymphs Abs: 0.8 10*3/uL (ref 0.7–4.0)
MCH: 25.8 pg — ABNORMAL LOW (ref 26.0–34.0)
MCH: 25.5 pg — ABNORMAL LOW (ref 26.0–34.0)
MCHC: 32.5 g/dL (ref 30.0–36.0)
MCV: 78.6 fL (ref 78.0–100.0)
MCV: 78.7 fL (ref 78.0–100.0)
Monocytes Relative: 5 % (ref 3–12)
Monocytes Relative: 5 % (ref 3–12)
Neutrophils Relative %: 84 % — ABNORMAL HIGH (ref 43–77)
Platelets: 144 10*3/uL — ABNORMAL LOW (ref 150–400)
RBC: 5.97 MIL/uL — ABNORMAL HIGH (ref 4.22–5.81)
RBC: 5.91 MIL/uL — ABNORMAL HIGH (ref 4.22–5.81)
WBC: 9.5 10*3/uL (ref 4.0–10.5)

## 2012-06-16 LAB — BASIC METABOLIC PANEL
BUN: 20 mg/dL (ref 6–23)
CO2: 21 mEq/L (ref 19–32)
Calcium: 9.9 mg/dL (ref 8.4–10.5)
GFR calc non Af Amer: 75 mL/min — ABNORMAL LOW (ref >90)
Glucose, Bld: 108 mg/dL — ABNORMAL HIGH (ref 70–99)
Sodium: 132 mEq/L — ABNORMAL LOW (ref 135–145)

## 2012-06-16 LAB — URINALYSIS, ROUTINE W REFLEX MICROSCOPIC
Glucose, UA: NEGATIVE mg/dL
Ketones, ur: 15 mg/dL — AB
Leukocytes, UA: NEGATIVE
Nitrite: NEGATIVE
Specific Gravity, Urine: 1.026 (ref 1.005–1.030)
pH: 5 (ref 5.0–8.0)

## 2012-06-16 LAB — LIPASE, BLOOD: Lipase: 25 U/L (ref 11–59)

## 2012-06-16 MED ORDER — SODIUM CHLORIDE 0.9 % IV BOLUS (SEPSIS)
1000.0000 mL | Freq: Once | INTRAVENOUS | Status: AC
  Administered 2012-06-16: 1000 mL via INTRAVENOUS

## 2012-06-16 MED ORDER — ONDANSETRON HCL 4 MG/2ML IJ SOLN
4.0000 mg | INTRAMUSCULAR | Status: AC
  Administered 2012-06-16: 4 mg via INTRAVENOUS
  Filled 2012-06-16: qty 2

## 2012-06-16 MED ORDER — FAMOTIDINE IN NACL 20-0.9 MG/50ML-% IV SOLN
20.0000 mg | Freq: Once | INTRAVENOUS | Status: AC
  Administered 2012-06-16: 20 mg via INTRAVENOUS
  Filled 2012-06-16: qty 50

## 2012-06-16 MED ORDER — MORPHINE SULFATE 4 MG/ML IJ SOLN
4.0000 mg | Freq: Once | INTRAMUSCULAR | Status: AC
  Administered 2012-06-16: 4 mg via INTRAVENOUS
  Filled 2012-06-16: qty 1

## 2012-06-16 MED ORDER — ONDANSETRON HCL 4 MG PO TABS: 4.0000 mg | ORAL_TABLET | Freq: Four times a day (QID) | ORAL | Status: DC

## 2012-06-16 NOTE — ED Notes (Signed)
Pt discharged home. Had no further questions at the time. Encouraged to follow up with PCP.

## 2012-06-16 NOTE — ED Notes (Signed)
Wife of pt. Stated, he started having stomach pain with diarrhea and throwing up since last night.
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
## 2012-06-17 NOTE — ED Provider Notes (Signed)
Medical screening examination/treatment/procedure(s) were conducted as a shared visit with non-physician practitioner(s) and myself.  I personally evaluated the patient during the encounter Patient with mild epigastric pain no vomiting and diarrhea that started 24 hours ago. Patient appears dehydrated but otherwise labs are within normal limits. Feel most likely viral process and patient discharged home  John Sprout, MD 06/17/12 1549

## 2018-04-03 ENCOUNTER — Telehealth: Payer: Self-pay

## 2018-04-03 NOTE — Telephone Encounter (Signed)
Pt appears on [facility] Quality Report for [facility], but has never been seen in the office.  I called the pt to confirm their PCP, but there was no answer and no option to leave a message.  VDM (DD) °

## 2018-04-06 ENCOUNTER — Telehealth: Payer: Self-pay

## 2018-04-06 NOTE — Telephone Encounter (Signed)
2nd attempt to reach pt to confirm PCP....no answer and no option to leave a message. VDM (DD)

## 2018-04-12 ENCOUNTER — Telehealth: Payer: Self-pay

## 2018-04-12 NOTE — Telephone Encounter (Signed)
3rd attempt to reach pt.  I spoke with interpreter who said patient doesn't have a primary care doctor right now, then she hung up. VDM (DD)

## 2018-05-09 ENCOUNTER — Telehealth: Payer: Self-pay

## 2018-05-09 NOTE — Telephone Encounter (Signed)
I called to try and schedule a new patient appointment since the patient appears on the [employer].  However, I was unable to schedule the appointment because he doesn't speak [place]. VDM (DD)

## 2018-05-16 DIAGNOSIS — H52221 Regular astigmatism, right eye: Secondary | ICD-10-CM | POA: Diagnosis not present

## 2018-05-16 DIAGNOSIS — H11153 Pinguecula, bilateral: Secondary | ICD-10-CM | POA: Diagnosis not present

## 2018-05-16 DIAGNOSIS — H5201 Hypermetropia, right eye: Secondary | ICD-10-CM | POA: Diagnosis not present

## 2018-05-31 DIAGNOSIS — H25812 Combined forms of age-related cataract, left eye: Secondary | ICD-10-CM | POA: Diagnosis not present

## 2018-05-31 DIAGNOSIS — H25813 Combined forms of age-related cataract, bilateral: Secondary | ICD-10-CM | POA: Diagnosis not present

## 2018-05-31 DIAGNOSIS — H25811 Combined forms of age-related cataract, right eye: Secondary | ICD-10-CM | POA: Diagnosis not present

## 2018-06-14 DIAGNOSIS — H25812 Combined forms of age-related cataract, left eye: Secondary | ICD-10-CM | POA: Diagnosis not present

## 2018-07-05 DIAGNOSIS — H25811 Combined forms of age-related cataract, right eye: Secondary | ICD-10-CM | POA: Diagnosis not present

## 2018-07-05 DIAGNOSIS — H2511 Age-related nuclear cataract, right eye: Secondary | ICD-10-CM | POA: Diagnosis not present

## 2019-01-17 DIAGNOSIS — H16143 Punctate keratitis, bilateral: Secondary | ICD-10-CM | POA: Diagnosis not present

## 2019-01-17 DIAGNOSIS — H02052 Trichiasis without entropian right lower eyelid: Secondary | ICD-10-CM | POA: Diagnosis not present

## 2019-04-20 ENCOUNTER — Other Ambulatory Visit: Payer: Self-pay

## 2019-04-20 ENCOUNTER — Encounter (HOSPITAL_COMMUNITY): Payer: Self-pay | Admitting: *Deleted

## 2019-04-20 ENCOUNTER — Emergency Department (HOSPITAL_COMMUNITY): Payer: Commercial Managed Care - HMO

## 2019-04-20 ENCOUNTER — Emergency Department (HOSPITAL_COMMUNITY)
Admission: EM | Admit: 2019-04-20 | Discharge: 2019-04-20 | Disposition: A | Discharge status: Home / Self Care | Payer: Commercial Managed Care - HMO | Attending: Emergency Medicine | Admitting: Emergency Medicine

## 2019-04-20 DIAGNOSIS — R0602 Shortness of breath: Secondary | ICD-10-CM | POA: Diagnosis not present

## 2019-04-20 DIAGNOSIS — U071 COVID-19: Secondary | ICD-10-CM

## 2019-04-20 DIAGNOSIS — Z79899 Other long term (current) drug therapy: Secondary | ICD-10-CM | POA: Insufficient documentation

## 2019-04-20 DIAGNOSIS — J449 Chronic obstructive pulmonary disease, unspecified: Secondary | ICD-10-CM | POA: Diagnosis not present

## 2019-04-20 DIAGNOSIS — R05 Cough: Secondary | ICD-10-CM | POA: Diagnosis not present

## 2019-04-20 DIAGNOSIS — R079 Chest pain, unspecified: Secondary | ICD-10-CM | POA: Diagnosis not present

## 2019-04-20 DIAGNOSIS — Z87891 Personal history of nicotine dependence: Secondary | ICD-10-CM | POA: Insufficient documentation

## 2019-04-20 LAB — BASIC METABOLIC PANEL
Anion gap: 10 (ref 5–15)
BUN: 9 mg/dL (ref 8–23)
CO2: 23 mmol/L (ref 22–32)
Calcium: 8.5 mg/dL — ABNORMAL LOW (ref 8.9–10.3)
Chloride: 96 mmol/L — ABNORMAL LOW (ref 98–111)
Creatinine, Ser: 0.67 mg/dL (ref 0.61–1.24)
GFR calc Af Amer: >60 mL/min (ref >60)
GFR calc non Af Amer: >60 mL/min (ref >60)
Glucose, Bld: 101 mg/dL — ABNORMAL HIGH (ref 70–99)
Potassium: 4 mmol/L (ref 3.5–5.1)
Sodium: 129 mmol/L — ABNORMAL LOW (ref 135–145)

## 2019-04-20 LAB — HEPATIC FUNCTION PANEL
ALT: 26 U/L (ref 0–44)
AST: 35 U/L (ref 15–41)
Albumin: 3.3 g/dL — ABNORMAL LOW (ref 3.5–5.0)
Alkaline Phosphatase: 83 U/L (ref 38–126)
Bilirubin, Direct: 0.1 mg/dL (ref 0.0–0.2)
Indirect Bilirubin: 0.7 mg/dL (ref 0.3–0.9)
Total Bilirubin: 0.8 mg/dL (ref 0.3–1.2)
Total Protein: 7.2 g/dL (ref 6.5–8.1)

## 2019-04-20 LAB — CBC
HCT: 42.1 % (ref 39.0–52.0)
Hemoglobin: 13.4 g/dL (ref 13.0–17.0)
MCH: 26.5 pg (ref 26.0–34.0)
MCHC: 31.8 g/dL (ref 30.0–36.0)
MCV: 83.2 fL (ref 80.0–100.0)
Platelets: 165 10*3/uL (ref 150–400)
RBC: 5.06 MIL/uL (ref 4.22–5.81)
RDW: 12.6 % (ref 11.5–15.5)
WBC: 7.8 10*3/uL (ref 4.0–10.5)
nRBC: 0 % (ref 0.0–0.2)

## 2019-04-20 LAB — TROPONIN I (HIGH SENSITIVITY)
Troponin I (High Sensitivity): 12 ng/L (ref <18)
Troponin I (High Sensitivity): 12 ng/L (ref <18)

## 2019-04-20 LAB — LACTIC ACID, PLASMA
Lactic Acid, Venous: 1.5 mmol/L (ref 0.5–1.9)
Lactic Acid, Venous: 1.4 mmol/L (ref 0.5–1.9)

## 2019-04-20 LAB — POC SARS CORONAVIRUS 2 AG -  ED: SARS Coronavirus 2 Ag: POSITIVE — AB

## 2019-04-20 MED ORDER — SODIUM CHLORIDE 0.9% FLUSH
3.0000 mL | Freq: Once | INTRAVENOUS | Status: AC
  Administered 2019-04-20: 3 mL via INTRAVENOUS

## 2019-04-20 MED ORDER — SODIUM CHLORIDE 0.9 % IV BOLUS
500.0000 mL | Freq: Once | INTRAVENOUS | Status: AC
  Administered 2019-04-20: 500 mL via INTRAVENOUS

## 2019-04-20 MED ORDER — ALBUTEROL SULFATE HFA 108 (90 BASE) MCG/ACT IN AERS
4.0000 | INHALATION_SPRAY | Freq: Once | RESPIRATORY_TRACT | Status: AC
  Administered 2019-04-20: 4 via RESPIRATORY_TRACT
  Filled 2019-04-20: qty 6.7

## 2019-04-20 MED ORDER — ONDANSETRON 4 MG PO TBDP: 4.0000 mg | ORAL_TABLET | Freq: Three times a day (TID) | ORAL | 0 refills | Status: DC | PRN

## 2019-04-20 NOTE — Discharge Instructions (Addendum)
Use the inhaler as directed. Your Covid test was positive so you will need to follow the instructions regarding quarantine. Return to the ED if you start to have worsening symptoms, trouble breathing or trouble swallowing, vomiting or coughing up blood.

## 2019-04-20 NOTE — ED Notes (Signed)
Pt placed on 2 L [place] for rr of 36.

## 2019-04-20 NOTE — ED Triage Notes (Addendum)
Pt states sob sore throat, cough, chest pain, green urine, abdominal pain for several days.  However, today it became difficult to breath.  RR of 36 at rest.

## 2019-04-20 NOTE — ED Notes (Signed)
Ambulated pt in room , with no O2 on, pt sats stayed 98-100% throughout walk.

## 2019-04-20 NOTE — ED Notes (Signed)
Patient verbalizes understanding of discharge instructions. Opportunity for questioning and answers were provided. Armband removed by staff, pt discharged from ED. Pt. ambulatory and discharged home.  

## 2019-04-20 NOTE — ED Provider Notes (Signed)
[place] EMERGENCY DEPARTMENT Provider Note   CSN: [id number] Arrival date & time: 04/20/19  1650     History Chief Complaint  Patient presents with   Shortness of Breath    sore throat, abodminal pain    John Luna is a 73 y.o. male with a past medical history of COPD presenting to the ED with a chief complaint of shortness of breath.  Reports for the past 2 to 3 days been having progressive worsening shortness of breath, sore throat, dry cough, chest pain, abdominal pain.  Has been taking Tylenol with minimal improvement in symptoms.  No sick contacts with similar symptoms, no known COVID-19 exposures.  Does not wear supplemental oxygen at baseline.  Denies any dysuria, diarrhea, sick contacts with similar symptoms.  The history is provided by the patient. The history is limited by a language barrier. A language interpreter was used.       History reviewed. No pertinent past medical history.  Patient Active Problem List   Diagnosis Date Noted   Abnormal CXR 09/03/2011   COPD (chronic obstructive pulmonary disease) ([place]) 09/03/2011   URI, acute 09/03/2011    History reviewed. No pertinent surgical history.     No family history on file.  Social History   Tobacco Use   Smoking status: Former Smoker    Packs/day: 1.00    Years: 50.00    Pack years: 50.00    Types: Cigarettes    Quit date: 09/13/2011    Years since quitting: 7.6   Smokeless tobacco: Never Used  Substance Use Topics   Alcohol use: Yes   Drug use: No    Home Medications Prior to Admission medications   Medication Sig Start Date End Date Taking? Authorizing Provider  acetaminophen (TYLENOL) 325 MG tablet Take 650 mg by mouth every 6 (six) hours as needed.    [provider]  famotidine (PEPCID) 20 MG tablet One after bfast and at bedtime 09/02/11   Tanda Rockers, MD  Multiple Vitamin (MULTIVITAMIN WITH MINERALS) TABS Take 1 tablet by mouth daily.     [provider]  ondansetron (ZOFRAN) 4 MG tablet Take 1 tablet (4 mg total) by mouth every 6 (six) hours. 06/16/12   Antonietta Breach, PA-C    Allergies    Patient has no known allergies.  Review of Systems   Review of Systems  Constitutional: Positive for chills and fever. Negative for appetite change.  HENT: Positive for sore throat. Negative for ear pain, rhinorrhea and sneezing.   Eyes: Negative for photophobia and visual disturbance.  Respiratory: Positive for shortness of breath. Negative for cough, chest tightness and wheezing.   Cardiovascular: Positive for chest pain. Negative for palpitations.  Gastrointestinal: Positive for abdominal pain. Negative for blood in stool, constipation, diarrhea, nausea and vomiting.  Genitourinary: Negative for dysuria, hematuria and urgency.  Musculoskeletal: Negative for myalgias.  Skin: Negative for rash.  Neurological: Negative for dizziness, weakness and light-headedness.    Physical Exam Updated Vital Signs BP 116/73    Pulse (!) 106    Temp 99.9 F (37.7 C) (Oral)    Resp (!) 27    Ht 5\' 2"  (1.575 m)    Wt 40.9 kg    SpO2 100%    BMI 16.49 kg/m   Physical Exam Vitals and nursing note reviewed.  Constitutional:      General: He is not in acute distress.    Appearance: He is well-developed.  HENT:  Head: Normocephalic and atraumatic.     Nose: Nose normal.  Eyes:     General: No scleral icterus.       Left eye: No discharge.     Conjunctiva/sclera: Conjunctivae normal.  Cardiovascular:     Rate and Rhythm: Regular rhythm. Tachycardia present.     Heart sounds: Normal heart sounds. No murmur. No friction rub. No gallop.   Pulmonary:     Effort: Pulmonary effort is normal. Tachypnea present. No respiratory distress.     Breath sounds: Normal breath sounds.  Abdominal:     General: Bowel sounds are normal. There is no distension.     Palpations: Abdomen is soft.     Tenderness: There is no abdominal tenderness. There  is no guarding.  Musculoskeletal:        General: Normal range of motion.     Cervical back: Normal range of motion and neck supple.  Skin:    General: Skin is warm and dry.     Findings: No rash.  Neurological:     Mental Status: He is alert.     Motor: No abnormal muscle tone.     Coordination: Coordination normal.     ED Results / Procedures / Treatments   Labs (all labs ordered are listed, but only abnormal results are displayed) Labs Reviewed  BASIC METABOLIC PANEL - Abnormal; Notable for the following components:      Result Value   Sodium 129 (*)    Chloride 96 (*)    Glucose, Bld 101 (*)    Calcium 8.5 (*)    All other components within normal limits  HEPATIC FUNCTION PANEL - Abnormal; Notable for the following components:   Albumin 3.3 (*)    All other components within normal limits  POC SARS CORONAVIRUS 2 AG -  ED - Abnormal; Notable for the following components:   SARS Coronavirus 2 Ag POSITIVE (*)    All other components within normal limits  URINE CULTURE  CBC  LACTIC ACID, PLASMA  LACTIC ACID, PLASMA  URINALYSIS, ROUTINE W REFLEX MICROSCOPIC  TROPONIN I (HIGH SENSITIVITY)  TROPONIN I (HIGH SENSITIVITY)    EKG EKG Interpretation  Date/Time:  Saturday April 20 2019 21:07:26 EST Ventricular Rate:  94 PR Interval:    QRS Duration: 93 QT Interval:  369 QTC Calculation: 462 R Axis:   82 Text Interpretation: Sinus rhythm Probable left atrial enlargement Borderline right axis deviation Interpretation limited secondary to artifact No old tracing to compare Confirmed by Pricilla Loveless [phone]) on 04/20/2019 9:39:34 PM   Radiology DG Chest Portable 1 View  Result Date: 04/20/2019 CLINICAL DATA:  Shortness of breath with sore throat, cough and chest pain. EXAM: PORTABLE CHEST 1 VIEW COMPARISON:  Radiographs 09/20/2011.  CT 08/31/2011. FINDINGS: 1933 hours. The heart size and mediastinal contours are stable with aortic atherosclerosis. A chronic 3.4 cm  mass at the left lung base and multiple right lung nodules are stable from 2013, consistent with benign findings. There is stable calcified pleural thickening on the right. No confluent airspace opacity, significant pleural effusion or pneumothorax. The bones appear unchanged. Telemetry leads overlie the chest. IMPRESSION: 1. 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 2. No acute cardiopulmonary process. Electronically Signed   By: Carey Bullocks M.D.   On: 04/20/2019 20:10    Procedures Procedures (including critical care time)  Medications Ordered in ED Medications  sodium chloride flush (NS) 0.9 % injection 3 mL (3 mLs  Intravenous Given 04/20/19 2149)  sodium chloride 0.9 % bolus 500 mL (0 mLs Intravenous Stopped 04/20/19 2155)  albuterol (VENTOLIN HFA) 108 (90 Base) MCG/ACT inhaler 4 puff (4 puffs Inhalation Given 04/20/19 2155)    ED Course  I have reviewed the triage vital signs and the nursing notes.  Pertinent labs & imaging results that were available during my care of the patient were reviewed by me and considered in my medical decision making (see chart for details).    MDM Rules/Calculators/A&P                      John Luna was evaluated in Emergency Department on 04/20/19  for the symptoms described in the history of present illness. He/she was evaluated in the context of the global COVID-19 pandemic, which necessitated consideration that the patient might be at risk for infection with the SARS-CoV-2 virus that causes COVID-19. Institutional protocols and algorithms that pertain to the evaluation of patients at risk for COVID-19 are in a state of rapid change based on information released by regulatory bodies including the CDC and federal and state organizations. These policies and algorithms were followed during the patient's care in the ED.  73 year old male with past medical history of COPD presenting to  the ED with a chief complaint of shortness of breath.  States that for the past few days he has been having worsening shortness of breath, cough, chest pain and abdominal pain.  He has been taking Tylenol with improvement in his chills.  On exam patient is tachypneic but not hypoxic.  He was given albuterol which helped with shortness of breath.  Chest x-ray shows chronic changes with no acute findings.  Lab work significant for positive Covid test, normal lactic acid, initial and delta troponin both negative, BMP with hyponatremia of 129.  EKG shows sinus rhythm without acute ischemic changes.  Patient was given 500 cc bolus.  He ambulated here without supplemental oxygen with oxygen saturations remaining above 98% on room air. Feel that he is stable for discharge home with outpatient follow up and return precautions. Will give Zofran for nausea prn.  Patient is hemodynamically stable, in NAD, and able to ambulate in the ED. Evaluation does not show pathology that would require ongoing emergent intervention or inpatient treatment. I explained the diagnosis to the patient. Pain has been managed and has no complaints prior to discharge. Patient is comfortable with above plan and is stable for discharge at this time. All questions were answered prior to disposition. Strict return precautions for returning to the ED were discussed. Encouraged follow up with PCP.   An After Visit Summary was printed and given to the patient.   Portions of this note were generated with Scientist, clinical (histocompatibility and immunogenetics)Dragon dictation software. Dictation errors may occur despite best attempts at proofreading.  Final Clinical Impression(s) / ED Diagnoses Final diagnoses:  COVID-19 virus infection    Rx / DC Orders ED Discharge Orders    None       Dietrich PatesKhatri, Akari Defelice, PA-C 04/20/19 2309    Pricilla LovelessGoldston, Scott, MD 04/21/19 1506

## 2019-06-19 ENCOUNTER — Emergency Department (HOSPITAL_COMMUNITY): Payer: Medicare HMO

## 2019-06-19 ENCOUNTER — Inpatient Hospital Stay (HOSPITAL_COMMUNITY)
Admission: EM | Admit: 2019-06-19 | Discharge: 2019-06-21 | DRG: 291 | Disposition: A | Discharge status: Home / Self Care | Payer: Medicare HMO | Attending: Internal Medicine | Admitting: Internal Medicine

## 2019-06-19 ENCOUNTER — Inpatient Hospital Stay (HOSPITAL_COMMUNITY): Payer: Medicare HMO

## 2019-06-19 DIAGNOSIS — Z87891 Personal history of nicotine dependence: Secondary | ICD-10-CM

## 2019-06-19 DIAGNOSIS — I509 Heart failure, unspecified: Secondary | ICD-10-CM | POA: Diagnosis not present

## 2019-06-19 DIAGNOSIS — G8929 Other chronic pain: Secondary | ICD-10-CM | POA: Diagnosis present

## 2019-06-19 DIAGNOSIS — E871 Hypo-osmolality and hyponatremia: Secondary | ICD-10-CM | POA: Diagnosis not present

## 2019-06-19 DIAGNOSIS — R0602 Shortness of breath: Secondary | ICD-10-CM | POA: Diagnosis not present

## 2019-06-19 DIAGNOSIS — Z20822 Contact with and (suspected) exposure to covid-19: Secondary | ICD-10-CM | POA: Diagnosis present

## 2019-06-19 DIAGNOSIS — R29818 Other symptoms and signs involving the nervous system: Secondary | ICD-10-CM | POA: Diagnosis not present

## 2019-06-19 DIAGNOSIS — R4182 Altered mental status, unspecified: Secondary | ICD-10-CM

## 2019-06-19 DIAGNOSIS — J810 Acute pulmonary edema: Secondary | ICD-10-CM | POA: Diagnosis not present

## 2019-06-19 DIAGNOSIS — R64 Cachexia: Secondary | ICD-10-CM | POA: Diagnosis not present

## 2019-06-19 DIAGNOSIS — Z681 Body mass index (BMI) 19 or less, adult: Secondary | ICD-10-CM | POA: Diagnosis not present

## 2019-06-19 DIAGNOSIS — E43 Unspecified severe protein-calorie malnutrition: Secondary | ICD-10-CM | POA: Diagnosis not present

## 2019-06-19 DIAGNOSIS — I351 Nonrheumatic aortic (valve) insufficiency: Secondary | ICD-10-CM | POA: Diagnosis not present

## 2019-06-19 DIAGNOSIS — U071 COVID-19: Secondary | ICD-10-CM | POA: Diagnosis not present

## 2019-06-19 DIAGNOSIS — I5033 Acute on chronic diastolic (congestive) heart failure: Secondary | ICD-10-CM | POA: Diagnosis not present

## 2019-06-19 DIAGNOSIS — Z79899 Other long term (current) drug therapy: Secondary | ICD-10-CM | POA: Diagnosis not present

## 2019-06-19 DIAGNOSIS — R05 Cough: Secondary | ICD-10-CM

## 2019-06-19 DIAGNOSIS — D509 Iron deficiency anemia, unspecified: Secondary | ICD-10-CM | POA: Diagnosis present

## 2019-06-19 DIAGNOSIS — R0902 Hypoxemia: Secondary | ICD-10-CM | POA: Diagnosis not present

## 2019-06-19 DIAGNOSIS — F1010 Alcohol abuse, uncomplicated: Secondary | ICD-10-CM | POA: Diagnosis present

## 2019-06-19 DIAGNOSIS — R0789 Other chest pain: Secondary | ICD-10-CM | POA: Diagnosis not present

## 2019-06-19 DIAGNOSIS — R079 Chest pain, unspecified: Secondary | ICD-10-CM | POA: Diagnosis not present

## 2019-06-19 DIAGNOSIS — I5032 Chronic diastolic (congestive) heart failure: Secondary | ICD-10-CM

## 2019-06-19 DIAGNOSIS — I340 Nonrheumatic mitral (valve) insufficiency: Secondary | ICD-10-CM | POA: Diagnosis not present

## 2019-06-19 DIAGNOSIS — R059 Cough, unspecified: Secondary | ICD-10-CM

## 2019-06-19 LAB — COMPREHENSIVE METABOLIC PANEL
ALT: 8 U/L (ref 0–44)
ALT: 7 U/L (ref 0–44)
AST: 21 U/L (ref 15–41)
AST: 24 U/L (ref 15–41)
Albumin: 3.3 g/dL — ABNORMAL LOW (ref 3.5–5.0)
Albumin: 3.1 g/dL — ABNORMAL LOW (ref 3.5–5.0)
Alkaline Phosphatase: 87 U/L (ref 38–126)
Alkaline Phosphatase: 88 U/L (ref 38–126)
Anion gap: 8 (ref 5–15)
Anion gap: 13 (ref 5–15)
BUN: 6 mg/dL — ABNORMAL LOW (ref 8–23)
BUN: 5 mg/dL — ABNORMAL LOW (ref 8–23)
CO2: 24 mmol/L (ref 22–32)
CO2: 17 mmol/L — ABNORMAL LOW (ref 22–32)
Calcium: 8.8 mg/dL — ABNORMAL LOW (ref 8.9–10.3)
Calcium: 8.2 mg/dL — ABNORMAL LOW (ref 8.9–10.3)
Chloride: 93 mmol/L — ABNORMAL LOW (ref 98–111)
Chloride: 100 mmol/L (ref 98–111)
Creatinine, Ser: 0.61 mg/dL (ref 0.61–1.24)
Creatinine, Ser: 0.65 mg/dL (ref 0.61–1.24)
GFR calc Af Amer: >60 mL/min (ref >60)
GFR calc Af Amer: >60 mL/min (ref >60)
GFR calc non Af Amer: >60 mL/min (ref >60)
GFR calc non Af Amer: >60 mL/min (ref >60)
Glucose, Bld: 86 mg/dL (ref 70–99)
Glucose, Bld: 195 mg/dL — ABNORMAL HIGH (ref 70–99)
Potassium: 4.2 mmol/L (ref 3.5–5.1)
Potassium: 4 mmol/L (ref 3.5–5.1)
Sodium: 125 mmol/L — ABNORMAL LOW (ref 135–145)
Sodium: 130 mmol/L — ABNORMAL LOW (ref 135–145)
Total Bilirubin: 1.1 mg/dL (ref 0.3–1.2)
Total Bilirubin: 1 mg/dL (ref 0.3–1.2)
Total Protein: 7.6 g/dL (ref 6.5–8.1)
Total Protein: 6.3 g/dL — ABNORMAL LOW (ref 6.5–8.1)

## 2019-06-19 LAB — URINALYSIS, ROUTINE W REFLEX MICROSCOPIC
Bilirubin Urine: NEGATIVE
Glucose, UA: NEGATIVE mg/dL
Hgb urine dipstick: NEGATIVE
Ketones, ur: NEGATIVE mg/dL
Leukocytes,Ua: NEGATIVE
Nitrite: NEGATIVE
Protein, ur: NEGATIVE mg/dL
Specific Gravity, Urine: 1.008 (ref 1.005–1.030)
pH: 8 (ref 5.0–8.0)

## 2019-06-19 LAB — CBC
HCT: 36.3 % — ABNORMAL LOW (ref 39.0–52.0)
Hemoglobin: 11.4 g/dL — ABNORMAL LOW (ref 13.0–17.0)
MCH: 24.5 pg — ABNORMAL LOW (ref 26.0–34.0)
MCHC: 31.4 g/dL (ref 30.0–36.0)
MCV: 77.9 fL — ABNORMAL LOW (ref 80.0–100.0)
Platelets: 190 10*3/uL (ref 150–400)
RBC: 4.66 MIL/uL (ref 4.22–5.81)
RDW: 13 % (ref 11.5–15.5)
WBC: 5.8 10*3/uL (ref 4.0–10.5)
nRBC: 0 % (ref 0.0–0.2)

## 2019-06-19 LAB — TROPONIN I (HIGH SENSITIVITY)
Troponin I (High Sensitivity): 12 ng/L
Troponin I (High Sensitivity): 12 ng/L (ref <18)

## 2019-06-19 LAB — ETHANOL: Alcohol, Ethyl (B): <10 mg/dL (ref <10)

## 2019-06-19 LAB — BRAIN NATRIURETIC PEPTIDE: B Natriuretic Peptide: 381.8 pg/mL — ABNORMAL HIGH (ref 0.0–100.0)

## 2019-06-19 LAB — LACTIC ACID, PLASMA: Lactic Acid, Venous: 1 mmol/L (ref 0.5–1.9)

## 2019-06-19 LAB — MAGNESIUM: Magnesium: 1.5 mg/dL — ABNORMAL LOW (ref 1.7–2.4)

## 2019-06-19 LAB — OSMOLALITY, URINE: Osmolality, Ur: 172 mOsm/kg — ABNORMAL LOW (ref 300–900)

## 2019-06-19 LAB — SODIUM, URINE, RANDOM: Sodium, Ur: 73 mmol/L

## 2019-06-19 LAB — PHOSPHORUS: Phosphorus: 3.1 mg/dL (ref 2.5–4.6)

## 2019-06-19 MED ORDER — THIAMINE HCL 100 MG PO TABS
100.0000 mg | ORAL_TABLET | Freq: Every day | ORAL | Status: DC
  Administered 2019-06-20 (×2): 100 mg via ORAL
  Filled 2019-06-19 (×2): qty 1

## 2019-06-19 MED ORDER — FUROSEMIDE 10 MG/ML IJ SOLN
40.0000 mg | Freq: Once | INTRAMUSCULAR | Status: AC
  Administered 2019-06-19: 40 mg via INTRAVENOUS
  Filled 2019-06-19: qty 4

## 2019-06-19 MED ORDER — SODIUM CHLORIDE 0.9 % IV BOLUS
1000.0000 mL | Freq: Once | INTRAVENOUS | Status: AC
  Administered 2019-06-19: 1000 mL via INTRAVENOUS

## 2019-06-19 MED ORDER — IPRATROPIUM-ALBUTEROL 0.5-2.5 (3) MG/3ML IN SOLN
3.0000 mL | Freq: Once | RESPIRATORY_TRACT | Status: AC
  Administered 2019-06-19: 17:00:00 3 mL via RESPIRATORY_TRACT
  Filled 2019-06-19: qty 3

## 2019-06-19 MED ORDER — ACETAMINOPHEN 325 MG PO TABS: 650.0000 mg | ORAL_TABLET | Freq: Four times a day (QID) | ORAL | Status: DC | PRN

## 2019-06-19 MED ORDER — IPRATROPIUM-ALBUTEROL 0.5-2.5 (3) MG/3ML IN SOLN
3.0000 mL | Freq: Once | RESPIRATORY_TRACT | Status: AC
  Administered 2019-06-19: 3 mL via RESPIRATORY_TRACT
  Filled 2019-06-19: qty 3

## 2019-06-19 MED ORDER — THIAMINE HCL 100 MG/ML IJ SOLN
100.0000 mg | Freq: Every day | INTRAMUSCULAR | Status: DC
  Filled 2019-06-19: qty 2

## 2019-06-19 MED ORDER — ENOXAPARIN SODIUM 40 MG/0.4ML ~~LOC~~ SOLN
40.0000 mg | SUBCUTANEOUS | Status: DC
  Administered 2019-06-20 – 2019-06-21 (×2): 40 mg via SUBCUTANEOUS
  Filled 2019-06-19 (×2): qty 0.4

## 2019-06-19 MED ORDER — ADULT MULTIVITAMIN W/MINERALS CH
1.0000 | ORAL_TABLET | Freq: Every day | ORAL | Status: DC
  Administered 2019-06-20 – 2019-06-21 (×3): 1 via ORAL
  Filled 2019-06-19 (×3): qty 1

## 2019-06-19 MED ORDER — CHLORDIAZEPOXIDE HCL 25 MG PO CAPS
25.0000 mg | ORAL_CAPSULE | Freq: Three times a day (TID) | ORAL | Status: DC
  Administered 2019-06-19: 25 mg via ORAL
  Filled 2019-06-19: qty 1

## 2019-06-19 MED ORDER — FOLIC ACID 1 MG PO TABS
1.0000 mg | ORAL_TABLET | Freq: Every day | ORAL | Status: DC
  Administered 2019-06-20 – 2019-06-21 (×3): 1 mg via ORAL
  Filled 2019-06-19 (×3): qty 1

## 2019-06-19 MED ORDER — IPRATROPIUM-ALBUTEROL 0.5-2.5 (3) MG/3ML IN SOLN: 3.0000 mL | Freq: Four times a day (QID) | RESPIRATORY_TRACT | Status: DC | PRN

## 2019-06-19 MED ORDER — METHYLPREDNISOLONE SODIUM SUCC 125 MG IJ SOLR
125.0000 mg | Freq: Once | INTRAMUSCULAR | Status: AC
  Administered 2019-06-19: 17:00:00 125 mg via INTRAVENOUS
  Filled 2019-06-19: qty 2

## 2019-06-19 MED ORDER — MAGNESIUM SULFATE 2 GM/50ML IV SOLN
2.0000 g | Freq: Once | INTRAVENOUS | Status: AC
  Administered 2019-06-20: 2 g via INTRAVENOUS
  Filled 2019-06-19: qty 50

## 2019-06-19 MED ORDER — ACETAMINOPHEN 650 MG RE SUPP: 650.0000 mg | Freq: Four times a day (QID) | RECTAL | Status: DC | PRN

## 2019-06-19 NOTE — ED Provider Notes (Signed)
[facility]-EMERGENCY DEPT [facility] Emergency Department Provider Note MRN:  [id number]  Arrival date & time: 06/19/19     Chief Complaint   Altered mental status History of Present Illness   John Luna is a 74 y.o. year-old male with no pertinent past medical history presenting to the ED with chief complaint of altered mental status  Patient is altered and unable to tell us what year it is, even with interpreter is unable to answer basic questions.  I was unable to obtain an accurate HPI, PMH, or ROS due to the patient's altered mental status.  Level 5 caveat.  Review of Systems  Positive for altered mental status.  Patient's Health History   No past medical history on file.  No past surgical history on file.  No family history on file.  Social History   Socioeconomic History  . Marital status: Married    Spouse name: Not on file  . Number of children: Not on file  . Years of education: Not on file  . Highest education level: Not on file  Occupational History  . Not on file  Tobacco Use  . Smoking status: Former Smoker    Packs/day: 1.00    Years: 50.00    Pack years: 50.00    Types: Cigarettes    Quit date: 09/13/2011    Years since quitting: 7.7  . Smokeless tobacco: Never Used  Substance and Sexual Activity  . Alcohol use: Yes  . Drug use: No  . Sexual activity: Not on file  Other Topics Concern  . Not on file  Social History Narrative  . Not on file   Social Determinants of Health   Financial Resource Strain:   . Difficulty of Paying Living Expenses: Not on file  Food Insecurity:   . Worried About Programme researcher, broadcasting/film/video in the Last Year: Not on file  . Ran Out of Food in the Last Year: Not on file  Transportation Needs:   . Lack of Transportation (Medical): Not on file  . Lack of Transportation (Non-Medical): Not on file  Physical Activity:   . Days of Exercise per Week: Not on file  . Minutes of Exercise per Session: Not on file  Stress:   .  Feeling of Stress : Not on file  Social Connections:   . Frequency of Communication with Friends and Family: Not on file  . Frequency of Social Gatherings with Friends and Family: Not on file  . Attends Religious Services: Not on file  . Active Member of Clubs or Organizations: Not on file  . Attends Banker Meetings: Not on file  . Marital Status: Not on file  Intimate Partner Violence:   . Fear of Current or Ex-Partner: Not on file  . Emotionally Abused: Not on file  . Physically Abused: Not on file  . Sexually Abused: Not on file     Physical Exam   Vitals:   06/19/19 1900 06/19/19 1915  BP: 135/82 (!) 141/72  Pulse: (!) 105 (!) 103  Resp: (!) 25 (!) 24  Temp:    SpO2: 98% 97%    CONSTITUTIONAL: Well-appearing, NAD NEURO: Awake, oriented only to name, moving all extremities, unable to answer basic questions EYES:  eyes equal and reactive ENT/NECK:  no LAD, no JVD CARDIO: Regular rate, well-perfused, normal S1 and S2 PULM:  CTAB no wheezing or rhonchi, mildly tachypneic GI/GU:  normal bowel sounds, non-distended, non-tender MSK/SPINE:  No gross deformities, no edema SKIN:  no  rash, atraumatic PSYCH:  Appropriate speech and behavior  *Additional and/or pertinent findings included in MDM below  Diagnostic and Interventional Summary    EKG Interpretation  Date/Time:  Wednesday June 19 2019 14:52:07 EST Ventricular Rate:  76 PR Interval:    QRS Duration: 94 QT Interval:  409 QTC Calculation: 460 R Axis:   86 Text Interpretation: Sinus rhythm Probable left atrial enlargement Borderline right axis deviation Confirmed by Gerlene Fee [phone]) on 06/19/2019 3:01:11 PM      Cardiac Monitoring Interpretation:  Labs Reviewed  URINALYSIS, ROUTINE W REFLEX MICROSCOPIC - Abnormal; Notable for the following components:      Result Value   Color, Urine STRAW (*)    All other components within normal limits  COMPREHENSIVE METABOLIC PANEL - Abnormal;  Notable for the following components:   Sodium 125 (*)    Chloride 93 (*)    BUN 6 (*)    Calcium 8.8 (*)    Albumin 3.3 (*)    All other components within normal limits  CBC - Abnormal; Notable for the following components:   Hemoglobin 11.4 (*)    HCT 36.3 (*)    MCV 77.9 (*)    MCH 24.5 (*)    All other components within normal limits  BRAIN NATRIURETIC PEPTIDE - Abnormal; Notable for the following components:   B Natriuretic Peptide 381.8 (*)    All other components within normal limits  CULTURE, BLOOD (SINGLE)  LACTIC ACID, PLASMA  ETHANOL    DG Chest Port 1 View  Final Result    CT HEAD WO CONTRAST    (Results Pending)    Medications  sodium chloride 0.9 % bolus 1,000 mL (0 mLs Intravenous Stopped 06/19/19 1721)  ipratropium-albuterol (DUONEB) 0.5-2.5 (3) MG/3ML nebulizer solution 3 mL (3 mLs Nebulization Given 06/19/19 1719)  ipratropium-albuterol (DUONEB) 0.5-2.5 (3) MG/3ML nebulizer solution 3 mL (3 mLs Nebulization Given 06/19/19 1719)  ipratropium-albuterol (DUONEB) 0.5-2.5 (3) MG/3ML nebulizer solution 3 mL (3 mLs Nebulization Given 06/19/19 1719)  methylPREDNISolone sodium succinate (SOLU-MEDROL) 125 mg/2 mL injection 125 mg (125 mg Intravenous Given 06/19/19 1719)     Procedures  /  Critical Care Procedures  ED Course and Medical Decision Making  I have reviewed the triage vital signs, the nursing notes, and pertinent available records from the EMR.  Pertinent labs & imaging results that were available during my care of the patient were reviewed by me and considered in my medical decision making (see below for details).     Altered mental status of unclear etiology, considering metabolic disarray, UTI, CNS abnormality, work-up pending.  7:30 PM: Work-up suggestive of pulmonary edema and possibly new onset CHF.  Patient also has hyponatremia.  Patient reportedly drinks every day, so this could be a component of beer potomania.  However given that he will need  diuresis and he is already hyponatremic, will admit to internal medicine service  Barth Kirks. Sedonia Small, MD [email or web address]  Final Clinical Impressions(s) / ED Diagnoses     ICD-10-CM   1. Altered mental status, unspecified altered mental status type  R41.82   2. Acute pulmonary edema (HCC)  J81.0   3. Hyponatremia  E87.1     ED Discharge Orders    None       Discharge Instructions Discussed with and Provided to Patient:   Discharge Instructions   None       Maudie Flakes, MD 06/19/19 [phone]

## 2019-06-19 NOTE — ED Triage Notes (Signed)
Pt arrives via EMS with c/o of increased SOB. Pt was dx with Covid X1 week. Pt increased SOB throughout last night.

## 2019-06-19 NOTE — ED Notes (Signed)
Spoke to son on phone. Wife asked for the  RN to call him as he speaks better english. Provided updated about pt and attempted to gather history on pt baseline mentation. Son stated he does not know of any memory issues but did inform this RN pt drinks beer everyday starting around 0900.  This RN did speak to pt with translator. Pt alert and oriented to self only at this time.   Pt breathing unlabored and stated he is ready to go home.

## 2019-06-19 NOTE — H&P (Signed)
Date: 06/19/2019               Patient Name:  John Luna MRN: [id number]  DOB: 03-Jan-1946 Age / Sex: 74 y.o., male   PCP: Patient, No Pcp Per         Medical Service: Internal Medicine Teaching Service         Attending Physician: Dr. Earl Lagos, MD    First Contact: Mcarthur Rossetti, MD, Sadia Pager: SA [phone])  Second Contact: Gwyneth Revels, MD, Marissa Pager: Sandrea Hammond [phone])       After Hours (After 5p/  First Contact Pager: [phone]  weekends / holidays): Second Contact Pager: [phone]   Chief Complaint: SOB  History of Present Illness:   History is obtained via chart review and phone call with the son, as patient is unable to participate.  John Luna is a 74 year old Montagnard speaking male with a past medical history significant for alcohol abuse who presented to the ED with SOB and confusion.  Earlier in the day, the patient's wife called [phone] because the patient was complaining of shortness of breath.  Unfortunately, his wife does not speak [place] and his son was not present, so he does not know the details.  Upon arrival to the hospital, the patient was alert but not oriented struggles to answer very basic questions, like why he is here or what symptoms he is experiencing. He states that he had chest pain, but feels better now.  He was unable to further characterize it.  On asking for clarification from his son, the son states that he has had chest pain for over a year as well as a cough has been intermittently on and off. Outside of that, the patient states that he "I feel fine".  He also told one of the ED providers that he was ready to go home earlier.  In the ED CBC, CMP, lactate, UA, BNP, and blood cultures were obtained. CBC demonstrated microcytic anemia.  CMP was notable for hyponatremia to 125.  BNP was significantly elevated to 381.8.  Urinalysis unremarkable.  EtOH level unremarkable. CXR demonstrated expected superimposed interstitial edema or bronchitis on  significant chronic lung disease.  In the ED, the patient received DuoNeb treatments, a dose of methylprednisolone 125 mg, and a liter of normal saline.  Meds:  Current Meds  Medication Sig  . acetaminophen (TYLENOL) 325 MG tablet Take 650 mg by mouth every 6 (six) hours as needed.  Marland Kitchen aspirin EC 81 MG tablet Take 81 mg by mouth every 6 (six) hours as needed for moderate pain.  . Multiple Vitamin (MULTIVITAMIN WITH MINERALS) TABS Take 1 tablet by mouth daily.   Allergies: Allergies as of 06/19/2019  . (No Known Allergies)   Past Medical History: -Alcohol use disorder  No past surgical history on file.  Family History: No family history on file.   Social History:  Social History   Tobacco Use  . Smoking status: Former Smoker    Packs/day: 1.00    Years: 50.00    Pack years: 50.00    Types: Cigarettes    Quit date: 09/13/2011    Years since quitting: 7.7  . Smokeless tobacco: Never Used  Substance Use Topics  . Alcohol use: Yes  . Drug use: No  -Lives at home with wife -Drinks roughly 7-8 beers daily. Usually starts drinking in the morning when he wakes up. -The pack-year smoking history, stopped 3 to 4 months ago  Review of Systems: A complete ROS was  negative except as per HPI.   Imaging: EKG: personally reviewed my interpretation is sinus rhythm  CXR:  IMPRESSION: 1. Stable significant chronic lung disease. 2. Suspect superimposed interstitial edema or bronchitis  CT Head: IMPRESSION: No significant CT finding. Mild age related volume loss. No focal or acute intracranial finding.   Physical Exam: Blood pressure (!) 141/72, pulse (!) 103, temperature 97.7 F (36.5 C), temperature source Oral, resp. rate (!) 24, SpO2 97 %.  Physical Exam Vitals reviewed.  Constitutional:      General: He is not in acute distress.    Appearance: He is not toxic-appearing.     Comments: Thin appearing  HENT:     Head: Normocephalic and atraumatic.     Comments: Temporal  wasting Eyes:     Extraocular Movements: Extraocular movements intact.     Pupils: Pupils are equal, round, and reactive to light.  Cardiovascular:     Rate and Rhythm: Regular rhythm. Tachycardia present.     Pulses: Normal pulses.     Heart sounds: Normal heart sounds. No murmur. No gallop.      Comments: JVD to the mid neck Pulmonary:     Effort: Pulmonary effort is normal.     Breath sounds: Normal breath sounds. No decreased breath sounds, wheezing or rhonchi.     Comments: Productive cough Chest:     Chest wall: No tenderness.  Abdominal:     General: Bowel sounds are normal.     Palpations: Abdomen is soft.     Tenderness: There is no abdominal tenderness. There is no guarding or rebound.  Musculoskeletal:     Right lower leg: No edema.     Left lower leg: No edema.  Psychiatric:        Mood and Affect: Mood normal.   Neurological: Mental Status: Patient is awake, alert, but not oriented.  Knows that he is at the hospital, but does not know where.  Not aware of the city. No signs of aphasia or neglect.  Mainly follows commands, but difficult to assess due to language barrier Cranial Nerves: II: Pupils equal, round, and reactive to light.   III,IV, VI: EOMI without ptosis or diploplia.  V: Facial sensation is symmetric to light touch VII: Facial movement is symmetric.  VIII: hearing is intact to voice X: Uvula elevates symmetrically XI: Shoulder shrug is symmetric. XII: tongue is midline without atrophy or fasciculations.  Motor: 4/5 effort throughout upper and lower extremities  Sensory: Sensation is grossly intact in bilateral UEs & LEs Cerebellar: R sided Finger-Nose dysmetria, L finger to nose intact  Assessment & Plan by Problem: Active Problems:   Shortness of breath  In summary, John Luna is a 74 year old Montagnard speaking gentleman with a past medical history significant for alcohol abuse who presented with altered mental status, chronic chest pain,  new-onset shortness of breath. His elevated BNP to 381.8 and JVD on physical exam, interstitial edema on CXR suggest heart failure is likely a large component to his SOB.  #Chest pain #Suspected CHF: BNP elevated to 381.8 on admission. JVD+ on physical exam. No history of CHF in the chart. Respiratory status is table on room air, and appears comfortable.  In regards to his chest pain, the patient son states that he has had pain in his chest for over a year, and this is not new for him.  Initial EKG appeared normal, and had no signs of ischemic process -Echocardiogram ordered -Lasix 40 mg once -F/U troponin -Daily weights -  Strict I's and O's  #Altered mental status #L arm dysmetria: Patient appears more confused but he is at his baseline.  His son states that have not knowing where he is, or cities and is abnormal for him. Neuro exam demonstrated L sided dysmetria with cerebellar testing. CT head did not show any acute findings -Consider MRI head  #Alcohol abuse: EtOH negative on admission. Patient son reports that he used to drink roughly 7-8 beers daily. Pt says he hasn't drank in 2 years.  -Thiamine 387 mg daily -Folic acid 1 mg dail -CIWA w/o ativan  #Microcytic anemia: Pt appears malnourished. I suspected his microcytic anemia is secondary to iron deficiency.  -Ferritin, Iron, TIBC, reticulocyte count ordered  #FEN/GI -Diet: Heart healthy -Fluids: None -Magnesium and phosphorus level ordered  #DVT prophylaxis -Lovenox 40 mg injections subq daily  #CODE STATUS: FULL  #Dispo: Admit patient to Observation with expected length of stay less than 2 midnights. Prior to Admission Living Arrangement: Home Anticipated Discharge Location: Home Barriers to Discharge:  Ongoing medical work-up  Signed: Earlene Plater, MD Internal Medicine, PGY1 Pager: [phone]  06/19/2019,7:31 PM

## 2019-06-20 ENCOUNTER — Inpatient Hospital Stay (HOSPITAL_COMMUNITY): Payer: Medicare HMO

## 2019-06-20 DIAGNOSIS — R4182 Altered mental status, unspecified: Secondary | ICD-10-CM

## 2019-06-20 DIAGNOSIS — I340 Nonrheumatic mitral (valve) insufficiency: Secondary | ICD-10-CM

## 2019-06-20 DIAGNOSIS — I5032 Chronic diastolic (congestive) heart failure: Secondary | ICD-10-CM

## 2019-06-20 DIAGNOSIS — I509 Heart failure, unspecified: Secondary | ICD-10-CM

## 2019-06-20 DIAGNOSIS — E43 Unspecified severe protein-calorie malnutrition: Secondary | ICD-10-CM | POA: Insufficient documentation

## 2019-06-20 DIAGNOSIS — E871 Hypo-osmolality and hyponatremia: Secondary | ICD-10-CM

## 2019-06-20 DIAGNOSIS — J810 Acute pulmonary edema: Secondary | ICD-10-CM

## 2019-06-20 DIAGNOSIS — I351 Nonrheumatic aortic (valve) insufficiency: Secondary | ICD-10-CM

## 2019-06-20 LAB — BLOOD GAS, ARTERIAL
Acid-base deficit: 3.3 mmol/L — ABNORMAL HIGH (ref 0.0–2.0)
Bicarbonate: 20.6 mmol/L (ref 20.0–28.0)
FIO2: 21
O2 Saturation: 95.7 %
Patient temperature: 36.7
pCO2 arterial: 32.3 mmHg (ref 32.0–48.0)
pH, Arterial: 7.418 (ref 7.350–7.450)
pO2, Arterial: 79.6 mmHg — ABNORMAL LOW (ref 83.0–108.0)

## 2019-06-20 LAB — RETICULOCYTES
Immature Retic Fract: 19.6 % — ABNORMAL HIGH (ref 2.3–15.9)
RBC.: 4.66 MIL/uL (ref 4.22–5.81)
Retic Count, Absolute: 76 10*3/uL (ref 19.0–186.0)
Retic Ct Pct: 1.6 % (ref 0.4–3.1)

## 2019-06-20 LAB — BASIC METABOLIC PANEL
Anion gap: 10 (ref 5–15)
Anion gap: 8 (ref 5–15)
Anion gap: 7 (ref 5–15)
BUN: 6 mg/dL — ABNORMAL LOW (ref 8–23)
BUN: 8 mg/dL (ref 8–23)
BUN: 18 mg/dL (ref 8–23)
CO2: 23 mmol/L (ref 22–32)
CO2: 24 mmol/L (ref 22–32)
CO2: 24 mmol/L (ref 22–32)
Calcium: 9.1 mg/dL (ref 8.9–10.3)
Calcium: 8.7 mg/dL — ABNORMAL LOW (ref 8.9–10.3)
Calcium: 9.3 mg/dL (ref 8.9–10.3)
Chloride: 102 mmol/L (ref 98–111)
Chloride: 102 mmol/L (ref 98–111)
Chloride: 105 mmol/L (ref 98–111)
Creatinine, Ser: 0.77 mg/dL (ref 0.61–1.24)
Creatinine, Ser: 0.88 mg/dL (ref 0.61–1.24)
Creatinine, Ser: 0.88 mg/dL (ref 0.61–1.24)
GFR calc Af Amer: >60 mL/min (ref >60)
GFR calc Af Amer: >60 mL/min (ref >60)
GFR calc Af Amer: >60 mL/min (ref >60)
GFR calc non Af Amer: >60 mL/min (ref >60)
GFR calc non Af Amer: >60 mL/min (ref >60)
GFR calc non Af Amer: >60 mL/min (ref >60)
Glucose, Bld: 202 mg/dL — ABNORMAL HIGH (ref 70–99)
Glucose, Bld: 239 mg/dL — ABNORMAL HIGH (ref 70–99)
Glucose, Bld: 133 mg/dL — ABNORMAL HIGH (ref 70–99)
Potassium: 4.1 mmol/L (ref 3.5–5.1)
Potassium: 4.1 mmol/L (ref 3.5–5.1)
Potassium: 5.1 mmol/L (ref 3.5–5.1)
Sodium: 135 mmol/L (ref 135–145)
Sodium: 134 mmol/L — ABNORMAL LOW (ref 135–145)
Sodium: 136 mmol/L (ref 135–145)

## 2019-06-20 LAB — CBC
HCT: 34.5 % — ABNORMAL LOW (ref 39.0–52.0)
Hemoglobin: 11.3 g/dL — ABNORMAL LOW (ref 13.0–17.0)
MCH: 24.2 pg — ABNORMAL LOW (ref 26.0–34.0)
MCHC: 32.8 g/dL (ref 30.0–36.0)
MCV: 74 fL — ABNORMAL LOW (ref 80.0–100.0)
Platelets: 231 10*3/uL (ref 150–400)
RBC: 4.66 MIL/uL (ref 4.22–5.81)
RDW: 12.6 % (ref 11.5–15.5)
WBC: 2.8 10*3/uL — ABNORMAL LOW (ref 4.0–10.5)
nRBC: 0 % (ref 0.0–0.2)

## 2019-06-20 LAB — CBC WITH DIFFERENTIAL/PLATELET
Abs Immature Granulocytes: 0.01 10*3/uL (ref 0.00–0.07)
Basophils Absolute: 0 10*3/uL (ref 0.0–0.1)
Basophils Relative: 0 %
Eosinophils Absolute: 0 10*3/uL (ref 0.0–0.5)
Eosinophils Relative: 0 %
HCT: 35 % — ABNORMAL LOW (ref 39.0–52.0)
Hemoglobin: 11.3 g/dL — ABNORMAL LOW (ref 13.0–17.0)
Immature Granulocytes: 0 %
Lymphocytes Relative: 19 %
Lymphs Abs: 0.5 10*3/uL — ABNORMAL LOW (ref 0.7–4.0)
MCH: 24.2 pg — ABNORMAL LOW (ref 26.0–34.0)
MCHC: 32.3 g/dL (ref 30.0–36.0)
MCV: 74.9 fL — ABNORMAL LOW (ref 80.0–100.0)
Monocytes Absolute: 0 10*3/uL — ABNORMAL LOW (ref 0.1–1.0)
Monocytes Relative: 1 %
Neutro Abs: 2.2 10*3/uL (ref 1.7–7.7)
Neutrophils Relative %: 80 %
Platelets: 240 10*3/uL (ref 150–400)
RBC: 4.67 MIL/uL (ref 4.22–5.81)
RDW: 12.9 % (ref 11.5–15.5)
WBC: 2.7 10*3/uL — ABNORMAL LOW (ref 4.0–10.5)
nRBC: 0 % (ref 0.0–0.2)

## 2019-06-20 LAB — FERRITIN: Ferritin: 198 ng/mL (ref 24–336)

## 2019-06-20 LAB — TSH: TSH: 0.94 u[IU]/mL (ref 0.350–4.500)

## 2019-06-20 LAB — IRON AND TIBC
Iron: 15 ug/dL — ABNORMAL LOW (ref 45–182)
Saturation Ratios: 5 % — ABNORMAL LOW (ref 17.9–39.5)
TIBC: 309 ug/dL (ref 250–450)
UIBC: 294 ug/dL

## 2019-06-20 LAB — LIPID PANEL
Cholesterol: 178 mg/dL (ref 0–200)
HDL: 57 mg/dL (ref >40)
LDL Cholesterol: 113 mg/dL — ABNORMAL HIGH (ref 0–99)
Total CHOL/HDL Ratio: 3.1 RATIO
Triglycerides: 42 mg/dL (ref <150)
VLDL: 8 mg/dL (ref 0–40)

## 2019-06-20 LAB — ECHOCARDIOGRAM COMPLETE

## 2019-06-20 MED ORDER — INFLUENZA VAC A&B SA ADJ QUAD 0.5 ML IM PRSY
0.5000 mL | PREFILLED_SYRINGE | INTRAMUSCULAR | Status: DC
  Filled 2019-06-20: qty 0.5

## 2019-06-20 MED ORDER — THIAMINE HCL 100 MG/ML IJ SOLN
500.0000 mg | Freq: Three times a day (TID) | INTRAVENOUS | Status: DC
  Administered 2019-06-20 (×2): 500 mg via INTRAVENOUS
  Filled 2019-06-20 (×5): qty 5

## 2019-06-20 MED ORDER — DEXTROSE 5 % IV BOLUS
500.0000 mL | Freq: Once | INTRAVENOUS | Status: AC
  Administered 2019-06-20: 06:00:00 500 mL via INTRAVENOUS

## 2019-06-20 MED ORDER — ENSURE ENLIVE PO LIQD
237.0000 mL | Freq: Two times a day (BID) | ORAL | Status: DC
  Administered 2019-06-20 – 2019-06-21 (×4): 237 mL via ORAL

## 2019-06-20 MED ORDER — DEXTROSE 5 % IV BOLUS
500.0000 mL | Freq: Once | INTRAVENOUS | Status: AC
  Administered 2019-06-20: 13:00:00 500 mL via INTRAVENOUS

## 2019-06-20 MED ORDER — DEXTROSE 5 % IV BOLUS
500.0000 mL | Freq: Once | INTRAVENOUS | Status: AC
  Administered 2019-06-20: 500 mL via INTRAVENOUS

## 2019-06-20 MED ORDER — PNEUMOCOCCAL VAC POLYVALENT 25 MCG/0.5ML IJ INJ: 0.5000 mL | INJECTION | INTRAMUSCULAR | Status: DC

## 2019-06-20 MED ORDER — THIAMINE HCL 100 MG PO TABS: 100.0000 mg | ORAL_TABLET | Freq: Every day | ORAL | Status: DC

## 2019-06-20 MED ORDER — SODIUM CHLORIDE 0.9 % IV SOLN
510.0000 mg | Freq: Once | INTRAVENOUS | Status: AC
  Administered 2019-06-20: 510 mg via INTRAVENOUS
  Filled 2019-06-20: qty 17

## 2019-06-20 MED ORDER — DICLOFENAC SODIUM 1 % EX GEL
4.0000 g | Freq: Four times a day (QID) | CUTANEOUS | Status: DC
  Filled 2019-06-20: qty 100

## 2019-06-20 NOTE — Progress Notes (Signed)
  Echocardiogram 2D Echocardiogram has been performed.  John Luna 06/20/2019, 8:14 AM

## 2019-06-20 NOTE — Progress Notes (Signed)
Initial Nutrition Assessment  DOCUMENTATION CODES:   Underweight, Severe malnutrition in context of chronic illness  INTERVENTION:   -Ensure Enlive po BID, each supplement provides 350 kcal and 20 grams of protein -Continue MVI with minerals daily  NUTRITION DIAGNOSIS:   Severe Malnutrition related to chronic illness(ETOH abuse) as evidenced by severe fat depletion, severe muscle depletion.  GOAL:   Patient will meet greater than or equal to 90% of their needs  MONITOR:   PO intake, Supplement acceptance, Labs, Weight trends, Skin, I & O's  REASON FOR ASSESSMENT:   Consult Assessment of nutrition requirement/status  ASSESSMENT:   John Luna is a 74 year old Montagnard speaking gentleman with a past medical history significant for alcohol abuse who presented with altered mental status, chronic chest pain, new-onset shortness of breath. His elevated BNP to 381.8 and JVD on physical exam, interstitial edema on CXR suggest heart failure is likely a large component to his SOB.  Pt admitted with syncope, possible CHF.   Reviewed I/O's: -1.7 L x 24 hours  UOP: 2.8 L x 24 hours  Case discussed with RN prior to visit. She reports his wife was here earlier and reports that pt typically eats very little at home, which is why he is so thin.   Pt sitting up in bed, smiling at time of visit. Pt speaks a rare dialect ([place] Rh[place]), which is difficult to obtain through interpreter services. No family members present in room at time of visit.   Weight obtained via bedscale. Noted wt has been stable over the past several years.   Medications reviewed and include thiamine and MVI.   Labs reviewed.   NUTRITION - FOCUSED PHYSICAL EXAM:    Most Recent Value  Orbital Region  Severe depletion  Upper Arm Region  Severe depletion  Thoracic and Lumbar Region  Severe depletion  Buccal Region  Severe depletion  Temple Region  Severe depletion  Clavicle Bone Region  Severe depletion   Clavicle and Acromion Bone Region  Severe depletion  Scapular Bone Region  Severe depletion  Dorsal Hand  Severe depletion  Patellar Region  Severe depletion  Anterior Thigh Region  Severe depletion  Posterior Calf Region  Severe depletion  Edema (RD Assessment)  None  Hair  Reviewed  Eyes  Reviewed  Mouth  Reviewed  Skin  Reviewed  Nails  Reviewed       Diet Order:   Diet Order            Diet Heart Room service appropriate? Yes; Fluid consistency: Thin  Diet effective now              EDUCATION NEEDS:   No education needs have been identified at this time  Skin:  Skin Assessment: Reviewed RN Assessment  Last BM:  Unknown  Height:   Ht Readings from Last 1 Encounters:  04/20/19 5\' 2"  (1.575 m)    Weight:   Wt Readings from Last 1 Encounters:  06/20/19 40.3 kg    Ideal Body Weight:  53.6 kg  BMI:  Body mass index is 16.25 kg/m.  Estimated Nutritional Needs:   Kcal:  1600-1800  Protein:  80-95 grams  Fluid:  > 1.6 L    06/22/19, RD, LDN, CDCES Registered Dietitian II Certified Diabetes Care and Education Specialist Please refer to [facility] for RD and/or RD on-call/weekend/after hours pager

## 2019-06-20 NOTE — Plan of Care (Signed)
  Problem: Education: Goal: Knowledge of General Education information will improve Description: Including pain rating scale, medication(s)/side effects and non-pharmacologic comfort measures Outcome: Progressing   Problem: Health Behavior/Discharge Planning: Goal: Ability to manage health-related needs will improve Outcome: Progressing   Problem: Clinical Measurements: Goal: Will remain free from infection Outcome: Progressing Goal: Respiratory complications will improve Outcome: Progressing   Problem: Nutrition: Goal: Adequate nutrition will be maintained Outcome: Progressing   Problem: Elimination: Goal: Will not experience complications related to bowel motility Outcome: Progressing   Problem: Pain Managment: Goal: General experience of comfort will improve Outcome: Progressing   Problem: Safety: Goal: Ability to remain free from injury will improve Outcome: Progressing   Problem: Skin Integrity: Goal: Risk for impaired skin integrity will decrease Outcome: Progressing   Problem: Education: Goal: Knowledge of General Education information will improve Description: Including pain rating scale, medication(s)/side effects and non-pharmacologic comfort measures Outcome: Progressing

## 2019-06-20 NOTE — Progress Notes (Signed)
Subjective: HD#1 Overnight, patient admitted with altered mental status and new onset heart failure.  This morning, patient evaluated at bedside. Translator used at bedside. He reported that he was came into the hospital yesterday. Able to tell us that he was in the hospital, didn't know the name, that he was in [place], the day, and the year. He reports that he has been hurting for a week now, pointed to his chest, reports it was painful to press on it. He was not able to sleep because of this. He reports that he a has a little bit of pain. Pain does not radiate. Improves with rest, does not change with activity. Denies any SOB. He feels that he has more liver and lung pain. Endorses leg weakness, decreased appetite. He denies any weight loss that he is aware of. He reports eating rice, vegetables, and meat at home; denies eating a lot of spicy foods. He finished his breakfast this morning.   Objective:  Vital signs in last 24 hours: Vitals:   06/19/19 2045 06/19/19 2130 06/19/19 2201 06/20/19 0411  BP: (!) 143/76 (!) 142/105 (!) 148/83 105/63  Pulse: (!) 106 (!) 124 98 78  Resp: (!) 24 (!) 23 17 16   Temp:   98.1 F (36.7 C) 98.2 F (36.8 C)  TempSrc:   Oral Oral  SpO2: 98% 90% 95% 98%   CBC Latest Ref Rng & Units 06/20/2019 06/19/2019 04/20/2019  WBC 4.0 - 10.5 K/uL 2.8(L) 5.8 7.8  Hemoglobin 13.0 - 17.0 g/dL 11.3(L) 11.4(L) 13.4  Hematocrit 39.0 - 52.0 % 34.5(L) 36.3(L) 42.1  Platelets 150 - 400 K/uL 231 190 165   BMP Latest Ref Rng & Units 06/20/2019 06/19/2019 06/19/2019  Glucose 70 - 99 mg/dL 202(H) 195(H) 86  BUN 8 - 23 mg/dL 6(L) 5(L) 6(L)  Creatinine 0.61 - 1.24 mg/dL 0.77 0.65 0.61  Sodium 135 - 145 mmol/L 135 130(L) 125(L)  Potassium 3.5 - 5.1 mmol/L 4.1 4.0 4.2  Chloride 98 - 111 mmol/L 102 100 93(L)  CO2 22 - 32 mmol/L 23 17(L) 24  Calcium 8.9 - 10.3 mg/dL 9.1 8.2(L) 8.8(L)   Physical Exam  Constitutional: He is oriented to person, place, and time. He appears  malnourished. He appears not jaundiced. He appears unhealthy. He appears cachectic. He does not have a sickly appearance. No distress.  HENT:  Head: Normocephalic and atraumatic.  Neck: No JVD present.  Cardiovascular: Normal rate, regular rhythm, normal heart sounds and intact distal pulses. Exam reveals no gallop.  No murmur heard. Pulmonary/Chest: Effort normal and breath sounds normal. No respiratory distress. He has no wheezes. He has no rales.  Abdominal: Soft. Bowel sounds are normal. He exhibits no distension. There is abdominal tenderness in the right lower quadrant, epigastric area, suprapubic area and left lower quadrant. There is no rebound.  Musculoskeletal:        General: Normal range of motion.  Neurological: He is alert and oriented to person, place, and time. No cranial nerve deficit. Coordination abnormal.  Skin: Skin is warm and dry. No rash noted. He is not diaphoretic.    Assessment/Plan: [name] is a 74 year old [place] speaking, community dwelling male with PMHx of alcohol use disorder presenting with one week of chest pain with decreased appetite and weakness, new onset shortness of breath and noted to have altered mental status on presentation. Work up consistent with acute heart failure exacerbation.  Acute diastolic heart failure exacerbation:  Patient presented with new onset  shortness of breath and one week history of central non-radiating chest pain with decreased appetite and weakness. EKG and troponin wnl. CXR with interstitial edema and elevated BNP concerning for new onset CHF. He received one dose of Lasix 40mg  in the ED with approximately 3L UOP (net -1.7L). Echo today with LVEF 60-65% with grade II diastolic dysfunction. Also noted to have bicuspid aortic valve without stenosis. On exam, no JVD or lower extremity edema noted. Lungs clear to auscultation bilaterally.  - BMP daily - Strict I&O - Daily weights - Will hold lasix for today   AMS  2/2 hyponatremia: Patient presented with confusion and noted to have left sided dysmetria on admission. Noted to have Na 125 for which he was given 1L NS in the ED. Na 135 this AM. On examination, he is more alert, oriented x3 and conversant. Dysmetria also improved on exam. MRI brain without acute intracranial abnormality noted. Given concerns for overcorrection of Na, patient given bolus of D5W. Repeat BMP Na 134.  - D5W 500cc bolus - BMP q8h  Severe malnutrition: Patient appears severely malnourished and cachectic on exam. He notes decreased appetite over the last week. He notes that he eats rice, vegetables and meat daily and does not endorse recent weight loss. His appetite is improved today and ate his breakfast.  - Nutrition consult - Continue to monitor  Alcohol and tobacco use:  Patient does have a significant alcohol use history with drinking approximately 7-8 beers daily for 7 years. However, patient notes that he hasn't drank anything in 2 years. He also notes significant smoking history but reports quitting 5 years ago.  -Thiamine 100 mg daily -Folic acid 1 mg daily -CIWA w/o ativan  FEN/GI -Diet: Heart healthy -Fluids: None -Electrolytes: Monitor and replete as needed  DVT prophylaxis -Lovenox 40 mg injections subq daily  Code status: FULL  Prior to Admission Living Arrangement: Home Anticipated Discharge Location: Home w/HH PT vs SNF Barriers to Discharge: Continued medical management  Dispo: Anticipated discharge in approximately 0-1 day(s).   , MD  Internal Medicine, PGY-1 Pager: [phone] 06/20/2019, 6:59 AM

## 2019-06-21 ENCOUNTER — Inpatient Hospital Stay (HOSPITAL_COMMUNITY): Payer: Medicare HMO

## 2019-06-21 LAB — CBC WITH DIFFERENTIAL/PLATELET
Abs Immature Granulocytes: 0.05 10*3/uL (ref 0.00–0.07)
Basophils Absolute: 0.1 10*3/uL (ref 0.0–0.1)
Basophils Relative: 1 %
Eosinophils Absolute: 0.1 10*3/uL (ref 0.0–0.5)
Eosinophils Relative: 1 %
HCT: 31.5 % — ABNORMAL LOW (ref 39.0–52.0)
Hemoglobin: 10.1 g/dL — ABNORMAL LOW (ref 13.0–17.0)
Immature Granulocytes: 1 %
Lymphocytes Relative: 17 %
Lymphs Abs: 1.8 10*3/uL (ref 0.7–4.0)
MCH: 24.3 pg — ABNORMAL LOW (ref 26.0–34.0)
MCHC: 32.1 g/dL (ref 30.0–36.0)
MCV: 75.9 fL — ABNORMAL LOW (ref 80.0–100.0)
Monocytes Absolute: 1.1 10*3/uL — ABNORMAL HIGH (ref 0.1–1.0)
Monocytes Relative: 10 %
Neutro Abs: 7.8 10*3/uL — ABNORMAL HIGH (ref 1.7–7.7)
Neutrophils Relative %: 70 %
Platelets: 220 10*3/uL (ref 150–400)
RBC: 4.15 MIL/uL — ABNORMAL LOW (ref 4.22–5.81)
RDW: 13.1 % (ref 11.5–15.5)
WBC: 10.9 10*3/uL — ABNORMAL HIGH (ref 4.0–10.5)
nRBC: 0 % (ref 0.0–0.2)

## 2019-06-21 LAB — BASIC METABOLIC PANEL
Anion gap: 7 (ref 5–15)
BUN: 13 mg/dL (ref 8–23)
CO2: 27 mmol/L (ref 22–32)
Calcium: 8.9 mg/dL (ref 8.9–10.3)
Chloride: 102 mmol/L (ref 98–111)
Creatinine, Ser: 0.84 mg/dL (ref 0.61–1.24)
GFR calc Af Amer: >60 mL/min (ref >60)
GFR calc non Af Amer: >60 mL/min (ref >60)
Glucose, Bld: 86 mg/dL (ref 70–99)
Potassium: 4.7 mmol/L (ref 3.5–5.1)
Sodium: 136 mmol/L (ref 135–145)

## 2019-06-21 LAB — MAGNESIUM: Magnesium: 1.9 mg/dL (ref 1.7–2.4)

## 2019-06-21 LAB — PHOSPHORUS: Phosphorus: 2 mg/dL — ABNORMAL LOW (ref 2.5–4.6)

## 2019-06-21 NOTE — Progress Notes (Signed)
Subjective: HD#2 Overnight, no acute events reported.   This morning, patient evaluated at bedside. Translator used at bedside. He notes feeling better this morning compared to when he presented. He notes that he has a new cough since this morning. Otherwise, his chest pain is significantly improved. He is alert and orientedx3.   Objective:  Vital signs in last 24 hours: Vitals:   06/20/19 1021 06/20/19 1517 06/20/19 1926 06/21/19 0442  BP:  (!) 114/55 112/63 (!) 115/56  Pulse:  (!) 103 98 97  Resp:  18 19 18   Temp:  (!) 97.5 F (36.4 C) 98 F (36.7 C) 98.4 F (36.9 C)  TempSrc:  Oral Oral Oral  SpO2:  100% 98% 100%  Weight: 40.3 kg      CBC Latest Ref Rng & Units 06/21/2019 06/20/2019 06/20/2019  WBC 4.0 - 10.5 K/uL 10.9(H) 2.7(L) 2.8(L)  Hemoglobin 13.0 - 17.0 g/dL 10.1(L) 11.3(L) 11.3(L)  Hematocrit 39.0 - 52.0 % 31.5(L) 35.0(L) 34.5(L)  Platelets 150 - 400 K/uL 220 240 231   BMP Latest Ref Rng & Units 06/21/2019 06/20/2019 06/20/2019  Glucose 70 - 99 mg/dL 86 06/22/2019) 062(B)  BUN 8 - 23 mg/dL 13 18 8   Creatinine 0.61 - 1.24 mg/dL 762(G 3.15  Sodium 135 - 145 mmol/L 136 136 134(L)  Potassium 3.5 - 5.1 mmol/L 4.7 5.1 4.1  Chloride 98 - 111 mmol/L 102 105 102  CO2 22 - 32 mmol/L 27 24 24   Calcium 8.9 - 10.3 mg/dL 8.9 9.3 1.76)   Physical Exam  Constitutional: He is oriented to person, place, and time. He appears malnourished. He appears not jaundiced. He appears unhealthy. He appears cachectic. He does not have a sickly appearance. No distress.  HENT:  Head: Normocephalic and atraumatic.  Neck: No JVD present.  Cardiovascular: Normal rate, regular rhythm, normal heart sounds and intact distal pulses. Exam reveals no gallop.  No murmur heard. Pulmonary/Chest: Effort normal. No respiratory distress.  Fine crackles noted at bilateral bases   Abdominal: Soft. Bowel sounds are normal. He exhibits no distension. There is abdominal tenderness in the left lower quadrant. There  is no rebound.  Tenderness to palpation of left lower quadrant and around sites of lovenox injection; stable  Musculoskeletal:        General: Normal range of motion.  Neurological: He is alert and oriented to person, place, and time. No cranial nerve deficit.  Skin: Skin is warm and dry. No rash noted. He is not diaphoretic.    Assessment/Plan: [name] is a 74 year old [place] speaking, community dwelling male with PMHx of alcohol use disorder presenting with one week of chest pain with decreased appetite and weakness, new onset shortness of breath and noted to have altered mental status on presentation. Work up consistent with acute heart failure exacerbation.  Acute diastolic heart failure exacerbation:  Patient presented with new onset shortness of breath and one week history of central non-radiating chest pain with decreased appetite and weakness. EKG and troponin wnl. CXR with interstitial edema and elevated BNP concerning for new onset CHF. He is net -2.4L after one dose of lasix 40mg  in the ED. Echo with grade II diastolic dysfunction w/bicuspid aortic valve without stenosis. He notes development of cough this morning. Also noted to have WBC 2.8>10.9 this AM but remains afebrile. Auscultation with fine crackles at bilateral bases. No lower extremity edema appreciated. Repeat CXR with interval improvement of interstitial edema. Patient stable for discharge today and will follow  up in clinic.  - Patient does not need to continue lasix at this time - Follow up in [facility] clinic in 1 week  AMS 2/2 hyponatremia: Patient presented with confusion and noted to have left sided dysmetria on admission. Noted to have Na 125 for which he was given 1L NS in the ED. Na 136 this AM. On examination, he is alert, oriented x3 and conversant.  - BMP at follow up   Severe malnutrition: Patient appears severely malnourished and cachectic on exam. He notes decreased appetite over the last week. He  notes that he eats rice, vegetables and meat daily and does not endorse recent weight loss. His appetite is improved today. Given significant alcohol use and tobacco use history, patient would benefit from malignancy work up. According to the son, he had 30lbs unintentional weight loss.  - Continue to monitor  FEN/GI -Diet: Heart healthy -Fluids: None -Electrolytes: Monitor and replete as needed  DVT prophylaxis -Lovenox 40 mg injections subq daily  Code status: FULL  Prior to Admission Living Arrangement: Home Anticipated Discharge Location: Home  Barriers to Discharge: None Dispo: Anticipated discharge today.   John Heck, MD  Internal Medicine, PGY-1 Pager: [phone] 06/21/2019, 7:03 AM

## 2019-06-21 NOTE — Progress Notes (Signed)
Internal Medicine Attending:   I saw and examined the patient. I reviewed the resident's note and I agree with the resident's findings and plan as documented in the resident's note.  Patient states that he feels better this morning except for a mild cough.  His chest pain is improved.  Patient is initially admitted to hospital with acute diastolic heart failure exacerbation as well as altered mental status likely secondary to hyponatremia.  Patient was started on IV Lasix in the ED and diuresed appropriately.  Currently he has mild crackles at the bases but no lower extremity edema and his chest x-ray shows improvement of his interstitial edema.  No further work-up at this time.  Patient stable for DC home today.  He is to follow-up in [facility] in 1 week.  Patient also noted to have hyponatremia on admission with sodium down to 125 which has now normalized slowly over the last 48 hours.  I suspect that the etiology of this is likely a combination of hypovolemic hyponatremia as well as malnutrition with decreased solute intake contributing to his hyponatremia.  No further work-up at this time.

## 2019-06-21 NOTE — Progress Notes (Signed)
Discharge paperwork given to pt. Pt is not in distress and tolerated well.

## 2019-06-21 NOTE — Plan of Care (Signed)
  Problem: Nutrition: Goal: Adequate nutrition will be maintained Outcome: Progressing   Problem: Safety: Goal: Ability to remain free from injury will improve Outcome: Progressing   

## 2019-06-21 NOTE — Discharge Instructions (Signed)
[name], John Luna were admitted to the hospital for shortness of breath, chest pain and confusion. Your work up was consistent with acute heart failure and your symptoms improved with fluid removal with Lasix. When you first presented, your sodium was low which likely contributed to your confusion. This improved as well.  Please follow up in the Internal Medicine Clinic in one week. You will be called to make an appointment.   Thank you!

## 2019-06-21 NOTE — Evaluation (Signed)
Occupational Therapy Evaluation Patient Details Name: John Luna MRN: [id number] DOB: [birth year] Today's Date: 06/21/2019    History of Present Illness Pt is a 74 y/o male admitted secondary to SOB and weakness, likely due to an acute diastolic heart failure exacerbation. No PMH on file.   Clinical Impression   Patient presenting with decreased I in self care, balance, functional mobility, transfers, endurance, and safety awareness.  Patient was independent PTA. Patient currently functioning S - min guard. Patient will benefit from acute OT to increase overall independence in the areas of ADLs, functional mobility, and safety awareness in order to safely discharge home with family.    Follow Up Recommendations  No OT follow up;Supervision/Assistance - 24 hour    Equipment Recommendations  None recommended by OT       Precautions / Restrictions Precautions Precautions: Fall Restrictions Weight Bearing Restrictions: No      Mobility Bed Mobility Overal bed mobility: Needs Assistance Bed Mobility: Supine to Sit;Sit to Supine     Supine to sit: Supervision Sit to supine: Supervision   General bed mobility comments: supervision for safety with mobility  Transfers Overall transfer level: Needs assistance Equipment used: None Transfers: Sit to/from Stand Sit to Stand: Min guard         General transfer comment: min guard for safety with transitional movement    Balance Overall balance assessment: Needs assistance Sitting-balance support: Feet unsupported;No upper extremity supported Sitting balance-Leahy Scale: Good     Standing balance support: No upper extremity supported Standing balance-Leahy Scale: Fair        ADL either performed or assessed with clinical judgement   ADL Overall ADL's : Needs assistance/impaired Eating/Feeding: Independent   Grooming: Wash/dry hands;Wash/dry face;Oral care;Standing;Supervision/safety   Upper Body Bathing: Set  up;Sitting   Lower Body Bathing: Min guard;Sit to/from stand   Upper Body Dressing : Set up;Sitting   Lower Body Dressing: Min guard;Sit to/from stand   Toilet Transfer: Supervision/safety   Toileting- Architect and Hygiene: Supervision/safety       Functional mobility during ADLs: Supervision/safety General ADL Comments: no AD utilized     Vision Baseline Vision/History: No visual deficits Patient Visual Report: No change from baseline              Pertinent Vitals/Pain Pain Assessment: Faces Faces Pain Scale: No hurt     Hand Dominance Right   Extremity/Trunk Assessment Upper Extremity Assessment Upper Extremity Assessment: Overall WFL for tasks assessed   Lower Extremity Assessment Lower Extremity Assessment: Overall WFL for tasks assessed   Cervical / Trunk Assessment Cervical / Trunk Assessment: Normal   Communication Communication Communication: Prefers language other than English;Other (comment)   Cognition Arousal/Alertness: Awake/alert Behavior During Therapy: WFL for tasks assessed/performed Overall Cognitive Status: Within Functional Limits for tasks assessed        General Comments: difficult to fully assess due to language barrier              Home Living Family/patient expects to be discharged to:: Private residence Living Arrangements: Spouse/significant other;Other relatives Available Help at Discharge: Family;Available 24 hours/day Type of Home: House Home Access: Level entry     Home Layout: One level     Home Equipment: None          Prior Functioning/Environment Level of Independence: Independent                 OT Problem List: Decreased strength;Decreased coordination;Decreased safety awareness;Impaired balance (sitting and/or standing);Decreased activity tolerance  OT Treatment/Interventions: Self-care/ADL training;Therapeutic exercise;Therapeutic activities;Energy conservation;DME and/or AE  instruction;Balance training;Patient/family education    OT Goals(Current goals can be found in the care plan section) Acute Rehab OT Goals Patient Stated Goal: to walk OT Goal Formulation: With patient Time For Goal Achievement: 07/05/19 Potential to Achieve Goals: Good ADL Goals Pt Will Perform Upper Body Bathing: with supervision Pt Will Perform Lower Body Bathing: with supervision Pt Will Perform Upper Body Dressing: with supervision Pt Will Perform Lower Body Dressing: with supervision Pt Will Transfer to Toilet: with supervision Pt Will Perform Toileting - Clothing Manipulation and hygiene: with supervision  OT Frequency: Min 2X/week   Barriers to D/C:    none known at this time          AM-PAC OT "6 Clicks" Daily Activity     Outcome Measure Help from another person eating meals?: None Help from another person taking care of personal grooming?: None Help from another person toileting, which includes using toliet, bedpan, or urinal?: A Little Help from another person bathing (including washing, rinsing, drying)?: A Little Help from another person to put on and taking off regular upper body clothing?: None Help from another person to put on and taking off regular lower body clothing?: A Little 6 Click Score: 21   End of Session Nurse Communication: Precautions  Activity Tolerance: Patient tolerated treatment well Patient left: in bed;with call bell/phone within reach;with bed alarm set  OT Visit Diagnosis: Unsteadiness on feet (R26.81);Muscle weakness (generalized) (M62.81)                Time: 0076-2263 OT Time Calculation (min): 10 min Charges:  OT General Charges $OT Visit: 1 Visit OT Evaluation $OT Eval Low Complexity: 1 Low   [name] P MS, OTR/L 06/21/2019, 12:56 PM

## 2019-06-21 NOTE — Evaluation (Signed)
Physical Therapy Evaluation Patient Details Name: John Luna MRN: [id number] DOB: Feb 26, 1946 Today's Date: 06/21/2019   History of Present Illness  Pt is a 74 y/o male admitted secondary to SOB and weakness, likely due to an acute diastolic heart failure exacerbation. No PMH on file.    Clinical Impression  Pt presented sitting up at EOB, awake and willing to participate in therapy session. Pt's wife present throughout session and preferring to interpret for him. Prior to admission, pt was independent with all functional mobility and ADLs. His lives with his wife in a single level home with a level entry. At the time of evaluation, pt overall moving well with min guard for safety without use of an AD. No LOB or difficulties throughout. No complaints of SOB or chest pain throughout. PT will continue to follow acutely to continue to progress mobility as tolerated and to ensure a safe d/c home.     Follow Up Recommendations No PT follow up    Equipment Recommendations  None recommended by PT    Recommendations for Other Services       Precautions / Restrictions Precautions Precautions: Fall Restrictions Weight Bearing Restrictions: No      Mobility  Bed Mobility               General bed mobility comments: pt seated upright at EOB upon arrival   Transfers Overall transfer level: Needs assistance Equipment used: None Transfers: Sit to/from Stand Sit to Stand: Min guard         General transfer comment: min guard for safety with transitional movement  Ambulation/Gait Ambulation/Gait assistance: Min guard Gait Distance (Feet): 75 Feet Assistive device: None Gait Pattern/deviations: Step-through pattern;Decreased stride length Gait velocity: decreased   General Gait Details: pt with mild instability but no overt LOB or need for physical assistance, min guard for safety and stability  Stairs            Wheelchair Mobility    Modified Rankin (Stroke  Patients Only)       Balance Overall balance assessment: Needs assistance Sitting-balance support: Feet unsupported;No upper extremity supported Sitting balance-Leahy Scale: Good     Standing balance support: No upper extremity supported Standing balance-Leahy Scale: Fair                               Pertinent Vitals/Pain Pain Assessment: No/denies pain    Home Living Family/patient expects to be discharged to:: Private residence Living Arrangements: Spouse/significant other;Other relatives Available Help at Discharge: Family;Available 24 hours/day Type of Home: House Home Access: Level entry     Home Layout: One level Home Equipment: None      Prior Function Level of Independence: Independent               Hand Dominance        Extremity/Trunk Assessment   Upper Extremity Assessment Upper Extremity Assessment: Defer to OT evaluation;Overall [facility] for tasks assessed    Lower Extremity Assessment Lower Extremity Assessment: Overall WFL for tasks assessed       Communication   Communication: Prefers language other than [place];Other (comment)(wife present and preferring to interpret)  Cognition Arousal/Alertness: Awake/alert Behavior During Therapy: WFL for tasks assessed/performed Overall Cognitive Status: Within Functional Limits for tasks assessed  General Comments: difficult to fully assess due to language barrier      General Comments      Exercises     Assessment/Plan    PT Assessment Patient needs continued PT services  PT Problem List Decreased mobility;Decreased coordination;Decreased safety awareness;Decreased knowledge of precautions       PT Treatment Interventions DME instruction;Gait training;Stair training;Functional mobility training;Therapeutic activities;Therapeutic exercise;Neuromuscular re-education;Balance training;Patient/family education    PT Goals (Current  goals can be found in the Care Plan section)  Acute Rehab PT Goals Patient Stated Goal: to walk PT Goal Formulation: With patient/family Time For Goal Achievement: 07/05/19 Potential to Achieve Goals: Good    Frequency Min 3X/week   Barriers to discharge        Co-evaluation               AM-PAC PT "6 Clicks" Mobility  Outcome Measure Help needed turning from your back to your side while in a flat bed without using bedrails?: None Help needed moving from lying on your back to sitting on the side of a flat bed without using bedrails?: None Help needed moving to and from a bed to a chair (including a wheelchair)?: A Little Help needed standing up from a chair using your arms (e.g., wheelchair or bedside chair)?: None Help needed to walk in hospital room?: A Little Help needed climbing 3-5 steps with a railing? : A Little 6 Click Score: 21    End of Session   Activity Tolerance: Patient tolerated treatment well Patient left: in bed;with call bell/phone within reach;with bed alarm set;Other (comment)(seated EOB) Nurse Communication: Mobility status PT Visit Diagnosis: Muscle weakness (generalized) (M62.81)    Time: 8144-8185 PT Time Calculation (min) (ACUTE ONLY): 11 min   Charges:   PT Evaluation $PT Eval Low Complexity: 1 Low          Ginette Pitman, PT, DPT  Acute Rehabilitation Services Pager [phone] Office [phone]    Alessandra Bevels [name] 06/21/2019, 12:30 PM

## 2019-06-23 NOTE — Discharge Summary (Signed)
Name: John Luna MRN: [id number] DOB: Aug 30, 1945 74 y.o. PCP: Patient, No Pcp Per  Date of Admission: 06/19/2019  2:34 PM Date of Discharge: 06/21/2019 Attending Physician: Dr. Earl Lagos  Discharge Diagnosis: 1. Acute diastolic heart failure exacerbation 2. Altered mental status 2/2 hyponatremia 3. Severe malnutrition  Discharge Medications: Allergies as of 06/21/2019   No Known Allergies     Medication List    TAKE these medications   acetaminophen 325 MG tablet Commonly known as: TYLENOL Take 650 mg by mouth every 6 (six) hours as needed.   aspirin EC 81 MG tablet Take 81 mg by mouth every 6 (six) hours as needed for moderate pain.   famotidine 20 MG tablet Commonly known as: PEPCID One after bfast and at bedtime   multivitamin with minerals Tabs tablet Take 1 tablet by mouth daily.   ondansetron 4 MG disintegrating tablet Commonly known as: Zofran ODT Take 1 tablet (4 mg total) by mouth every 8 (eight) hours as needed for nausea or vomiting.   ondansetron 4 MG tablet Commonly known as: ZOFRAN Take 1 tablet (4 mg total) by mouth every 6 (six) hours.       Disposition and follow-up:   JohnJawann Luna was discharged from [facility] in Stable condition.  At the hospital follow up visit please address:  1.  Acute diastolic heart failure: LVEF 60-65% with grade II diastolic dysfunction. Please assess volume status.   AMS 2/2 hyponatremia: Presented with AMS and Na of 125. Mental status improved with correction of hyponatremia. Please f/u BMP.   Severe malnutrition: Patient appears cachectic; significant alcohol and tobacco use history - may need follow up imaging. May need nutrition consult.   2.  Labs / imaging needed at time of follow-up: BMP  3.  Pending labs/ test needing follow-up: None  Follow-up Appointments: Follow-up Information    [facility]. Schedule an appointment as soon as possible for a visit  in 1 week(s).   Contact information: [address] [place] [address] [phone]          Hospital Course by problem list: 1. Acute diastolic heart failure exacerbation:  Patient presented with new onset shortness of breath and one week history of central non-radiating chest pain with decreased appetite and weakness. EKG and troponin wnl. CXR with interstitial edema and elevated BNP concerning for new onset CHF. Echo with LVEF 60-65%, grade II diastolic dysfunction and bicuspid aortic valve without stenosis. Patient received one dose of Lasix 40mg  in the ED with adequate urinary response. Noted to have fine crackles at bilateral bases but no lower extremity edema or JVD. Repeat CXR with interval improvement in interstitial edema. Patient discharged home without lasix.   2. Altered mental status 2/2 hyponatremia: Patient presented with confusion and noted to have left sided dysmetria on admission and Na of 125 for which he was given 1L NS in the ED. His sodium corrected by the next morning and had improvement in his mental status and neurologic exam. In order to prevent overcorrection, patient given D5W. Na 136 on discharge. Patient's mental status back to baseline on discharge.   3. Severe malnutrition:  Patient severely malnourished and cachectic on exam. He has had decreased appetite one week prior to admission. He notes that he eats rice, vegetables and meat daily. 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 Appetite improved during admission. 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   Discharge Vitals:   BP 130/67 (BP Location: Right Arm)   Pulse 87   Temp 97.8 F (36.6 C) (Oral)   Resp 16   Wt 40.3 kg   SpO2 98%   BMI 16.25  kg/m   Pertinent Labs, Studies, and Procedures:  CBC Latest Ref Rng & Units 06/21/2019 06/20/2019 06/20/2019  WBC 4.0 - 10.5 K/uL 10.9(H) 2.7(L) 2.8(L)  Hemoglobin 13.0 - 17.0 g/dL 10.1(L) 11.3(L) 11.3(L)  Hematocrit 39.0 - 52.0 % 31.5(L) 35.0(L) 34.5(L)  Platelets 150 - 400 K/uL 220 240 231   CMP Latest Ref Rng & Units 06/21/2019 06/20/2019 06/20/2019  Glucose 70 - 99 mg/dL 86 133(H) 239(H)  BUN 8 - 23 mg/dL 13 18 8   Creatinine 0.61 - 1.24 mg/dL 0.84 0.88 0.88  Sodium 135 - 145 mmol/L 136 136 134(L)  Potassium 3.5 - 5.1 mmol/L 4.7 5.1 4.1  Chloride 98 - 111 mmol/L 102 105 102  CO2 22 - 32 mmol/L 27 24 24   Calcium 8.9 - 10.3 mg/dL 8.9 9.3 8.7(L)  Total Protein 6.5 - 8.1 g/dL - - -  Total Bilirubin 0.3 - 1.2 mg/dL - - -  Alkaline Phos 38 - 126 U/L - - -  AST 15 - 41 U/L - - -  ALT 0 - 44 U/L - - -   BNP    Component Value Date/Time   BNP 381.8 (H) 06/19/2019 1740   CXR 06/19/2019: FINDINGS: 1933 hours. The heart size and mediastinal contours are stable with aortic atherosclerosis. A chronic 3.4 cm mass at the left lung base and multiple right lung nodules are stable from 2013, consistent with benign findings. There is stable calcified pleural thickening on the right. No confluent airspace opacity, significant pleural effusion or pneumothorax. The bones appear unchanged. Telemetry leads overlie the chest. IMPRESSION: 1. Stable significant chronic lung disease. 2. Suspect superimposed interstitial edema or bronchitis.  CT HEAD WO CONTRAST 06/19/2019: IMPRESSION: No significant CT finding. Mild age related volume loss. No focal or acute intracranial finding.  MR BRAIN WO CONTRAST 06/20/2019: IMPRESSION: 1. Prominent and diffuse intravascular susceptibility artifact on the gradient echo sequence, decreasing the sensitivity for detection of acute stroke. Correlate with history of recent parenteral iron administration. 2. No acute intracranial abnormality. 3. Mild parenchymal  volume loss and mild chronic small vessel Ischemia.  ECHO 06/20/2019: IMPRESSIONS  1. Left ventricular ejection fraction, by estimation, is 60 to 65%. The  left ventricle has normal function. The left ventricle has no regional  wall motion abnormalities. Left ventricular diastolic parameters are  consistent with Grade II diastolic  dysfunction (pseudonormalization).  2. Right ventricular systolic function is normal. The right ventricular  size is normal. Tricuspid regurgitation signal is inadequate for assessing  PA pressure.  3. Left atrial size was mildly dilated.  4. The mitral valve is normal in structure and function. Mild mitral  valve regurgitation. No evidence of mitral stenosis.  5. The aortic valve is bicuspid. Aortic valve regurgitation is mild. Mild  aortic valve sclerosis is present, with no evidence of aortic valve  stenosis.  6. The inferior vena cava is normal in size with greater than 50%  respiratory variability, suggesting right atrial pressure of 3 mmHg.   CXR 06/21/2019: IMPRESSION: Chronic lung changes with slight interval improved aeration. This is  likely resolving interstitial edema.  Discharge Instructions: Discharge Instructions    (HEART FAILURE PATIENTS) Call MD:  Anytime you have any of the following symptoms: 1) 3 pound weight gain in 24 hours or 5 pounds in 1 week 2) shortness of breath, with or without a dry hacking cough 3) swelling in the hands, feet or stomach 4) if you have to sleep on extra pillows at night in order to breathe.   Complete by: As directed    Call MD for:  difficulty breathing, headache or visual disturbances   Complete by: As directed    Call MD for:  extreme fatigue   Complete by: As directed    Call MD for:  hives   Complete by: As directed    Call MD for:  persistant dizziness or light-headedness   Complete by: As directed    Call MD for:  persistant nausea and vomiting   Complete by: As directed    Call MD for:   severe uncontrolled pain   Complete by: As directed    Call MD for:  temperature >100.4   Complete by: As directed    Diet - low sodium heart healthy   Complete by: As directed    Increase activity slowly   Complete by: As directed     Mr. Rajinder, [place] were admitted to the hospital for shortness of breath, chest pain and confusion. Your work up was consistent with acute heart failure and your symptoms improved with fluid removal with Lasix. When you first presented, your sodium was low which likely contributed to your confusion. This improved as well.  Please follow up in the Internal Medicine Clinic in one week. You will be called to make an appointment.   Thank you!  SignedEliezer Bottom, MD 06/23/2019, 4:21 PM   Pager: [phone]

## 2019-06-24 LAB — CULTURE, BLOOD (SINGLE)
Culture: NO GROWTH
Special Requests: ADEQUATE

## 2019-06-28 ENCOUNTER — Encounter: Payer: Self-pay | Admitting: Internal Medicine

## 2019-06-28 ENCOUNTER — Ambulatory Visit (INDEPENDENT_AMBULATORY_CARE_PROVIDER_SITE_OTHER): Payer: Medicare HMO | Admitting: Internal Medicine

## 2019-06-28 ENCOUNTER — Other Ambulatory Visit: Payer: Self-pay

## 2019-06-28 VITALS — BP 142/68 | HR 100 | Temp 97.8°F | Wt 90.4 lb

## 2019-06-28 DIAGNOSIS — K5900 Constipation, unspecified: Secondary | ICD-10-CM

## 2019-06-28 DIAGNOSIS — Z0000 Encounter for general adult medical examination without abnormal findings: Secondary | ICD-10-CM

## 2019-06-28 DIAGNOSIS — I5032 Chronic diastolic (congestive) heart failure: Secondary | ICD-10-CM

## 2019-06-28 DIAGNOSIS — E43 Unspecified severe protein-calorie malnutrition: Secondary | ICD-10-CM

## 2019-06-28 DIAGNOSIS — Z1159 Encounter for screening for other viral diseases: Secondary | ICD-10-CM

## 2019-06-28 DIAGNOSIS — E875 Hyperkalemia: Secondary | ICD-10-CM | POA: Diagnosis not present

## 2019-06-28 DIAGNOSIS — E871 Hypo-osmolality and hyponatremia: Secondary | ICD-10-CM

## 2019-06-28 NOTE — Assessment & Plan Note (Addendum)
Patient presenting for follow up after admission with heart failure exacerbation. This is a new diagnosis for him. BNP was elevated and Echo showed LVEF 60-65%, grade II diastolic dysfunction and bicuspid aortic valve without stenosis. Patient responded well to low doses of IV lasix and did not require Lasix on discharge.  Discharge weight was 40.3kg and is fairly stable 41kg. Clothing, different scales, or improved appetite could account for the weight difference. He has mild bibasilar rales on exam (on chart review, this is consistent with discharge exam) but no edema and denies Orthopnea or SOB. Will not start medication today. He has been instructed to contacted [place] if he notes weight gain of 5lbs in a week, SOB, or Edema. - Continue to monitor - BMP

## 2019-06-28 NOTE — Assessment & Plan Note (Signed)
Reports some abdominal pain associated with constipation with hard stools. Will start with OTC stool softeners/laxatives. - Dulcolax by mouth 10mg  Daily as needed ---If this does not work, daily ---If this does not work, Clinical biochemist our office

## 2019-06-28 NOTE — Assessment & Plan Note (Addendum)
Patient noted to be significantly malnourished in the setting deceased appetite for about 1 week prior to admission. He stated he eats rice, vegetables, and meat daily. He is a former drinker and smoker, but not for 5+ years. His appetite returned while admitted.  His appetite has continue to good since discharge. Encouraged continued varied diet. He has had some constipation and I have recommended graduated stool softener/laxtives (Dulcolax >> Miralax >> Call office). - Continue varied diet - Continue to monitor

## 2019-06-28 NOTE — Progress Notes (Signed)
Internal Medicine Clinic Attending  Case discussed with Dr. Melvin  at the time of the visit.  We reviewed the resident's history and exam and pertinent patient test results.  I agree with the assessment, diagnosis, and plan of care documented in the resident's note.  

## 2019-06-28 NOTE — Progress Notes (Signed)
   CC: Hospital follow up, Heart Failure, Hyponatremia, Malnutrition, Constipation   HPI:  JohnJohn Luna is a 74 y.o. M with PMHx listed below presenting for Hospital follow up, Heart Failure, Hyponatremia, Malnutrition, Constipation. Please see the A&P for the status of the patient's chronic medical problems.  Past Medical History:  Diagnosis Date  . Acute pulmonary edema (HCC)   . Altered mental status     Review of Systems:  Performed and all others negative.  Physical Exam:  Vitals:   06/28/19 1021  BP: (!) 142/68  Pulse: 100  Temp: 97.8 F (36.6 C)  TempSrc: Oral  SpO2: 100%  Weight: 90 lb 6.4 oz (41 kg)   Physical Exam Constitutional:      General: He is not in acute distress.    Comments: Thin appearing elderly male  Cardiovascular:     Rate and Rhythm: Normal rate and regular rhythm.     Pulses: Normal pulses.     Heart sounds: Normal heart sounds.  Pulmonary:     Effort: Pulmonary effort is normal. No respiratory distress.     Breath sounds: Rales present.     Comments: Mild bibasilar rales Abdominal:     General: Bowel sounds are normal. There is no distension.     Palpations: Abdomen is soft.     Tenderness: There is no abdominal tenderness.  Musculoskeletal:        General: No swelling or deformity.  Skin:    General: Skin is warm and dry.  Neurological:     General: No focal deficit present.     Mental Status: Mental status is at baseline.    Assessment & Plan:   See Encounters Tab for problem based charting.  Patient discussed with Dr. Rogelia Boga

## 2019-06-28 NOTE — Patient Instructions (Signed)
Thank you for allowing [place] to care for you    For your heart Failure: - No medication changes today   - If you gain 5lbs in a week, feel short of breath, or notice worsening leg swelling come to our office to be seen    For your Constipation - Take dulcolax by mouth 10mg  Daily as needed   - If this does not work, try Miralax daily   - if this does not work, contact out office    We will check lab work today, you will be contacted if this is abnormal, you will be mailed results if this is normal.    Follow up in about 3 months

## 2019-06-28 NOTE — Assessment & Plan Note (Addendum)
-   Hep C Screen  - TDAP deferred due to cost and not being covered by medicare  - He has limited funds and may need CCM if he has difficulty paying for visits.

## 2019-06-28 NOTE — Assessment & Plan Note (Addendum)
Patient recently hospitalized with hyponatremia and subsequent encephalopathy. His hyponatremia resolved with treatment and was 136 on discharge. Will recheck labs today - BMP  ADDENDUM: Hyperkalemia to 5.5 noted on BMP.  Results discuss with patient by phone with the assistance of office staff serving as translator. Patient is instructed to reduce or find alternative to high K containing foods and return in 1 week for repeat BMP.  He states he has been eating a number of foods higher in K: figs,dreed fruits, nuts, bananas, avocados, bananas, catelope, mango, spinach, and steak. He will work to reduce intake of these.

## 2019-06-29 LAB — BMP8+ANION GAP
Anion Gap: 14 mmol/L (ref 10.0–18.0)
BUN/Creatinine Ratio: 17 (ref 10–24)
BUN: 12 mg/dL (ref 8–27)
CO2: 23 mmol/L (ref 20–29)
Calcium: 9.7 mg/dL (ref 8.6–10.2)
Chloride: 99 mmol/L (ref 96–106)
Creatinine, Ser: 0.69 mg/dL — ABNORMAL LOW (ref 0.76–1.27)
GFR calc Af Amer: 110 mL/min/{1.73_m2} (ref >59)
GFR calc non Af Amer: 96 mL/min/{1.73_m2} (ref >59)
Glucose: 78 mg/dL (ref 65–99)
Potassium: 5.5 mmol/L — ABNORMAL HIGH (ref 3.5–5.2)
Sodium: 136 mmol/L (ref 134–144)

## 2019-06-29 LAB — HEPATITIS C ANTIBODY: Hep C Virus Ab: <0.1 s/co ratio (ref 0.0–0.9)

## 2019-07-02 ENCOUNTER — Encounter: Payer: Self-pay | Admitting: Internal Medicine

## 2019-07-02 NOTE — Progress Notes (Signed)
Results discuss with patient by phone with the assistance of office staff serving as translator. Patient is instructed to reduce or find alternative to high K containing foods and return in 1 week for repeat BMP.  He states he has been eating a number of foods higher in K: figs,dreed fruits, nuts, bananas, avocados, bananas, catelope, mango, spinach, and steak. He will work to reduce intake of these.

## 2019-07-09 ENCOUNTER — Other Ambulatory Visit (INDEPENDENT_AMBULATORY_CARE_PROVIDER_SITE_OTHER): Payer: Medicare HMO

## 2019-07-09 DIAGNOSIS — E875 Hyperkalemia: Secondary | ICD-10-CM | POA: Diagnosis not present

## 2019-07-09 HISTORY — DX: Hyperkalemia: E87.5

## 2019-07-09 NOTE — Assessment & Plan Note (Addendum)
ADDENDUM: Hyperkalemia to 5.5 noted on BMP.  Results discuss with patient by phone with the assistance of office staff serving as translator. Patient is instructed to reduce or find alternative to high K containing foods and return in 1 week for repeat BMP.  He states he has been eating a number of foods higher in K: figs,dreed fruits, nuts, bananas, avocados, bananas, catelope, mango, spinach, and steak. He will work to reduce intake of these.  He is to follow up for repeat BMP. Future orders will be placed - Repeat BMP scheduled for 07/09/19  ADDENDUM #2: Repeat BMP is WNL. Continue mild restriction of food high in potassium.

## 2019-07-09 NOTE — Addendum Note (Signed)
Addended by: Beola Cord B on: 07/09/2019 08:44 AM   Modules accepted: Orders

## 2019-07-10 LAB — BMP8+ANION GAP
Anion Gap: 11 mmol/L (ref 10.0–18.0)
BUN/Creatinine Ratio: 11 (ref 10–24)
BUN: 8 mg/dL (ref 8–27)
CO2: 24 mmol/L (ref 20–29)
Calcium: 8.9 mg/dL (ref 8.6–10.2)
Chloride: 102 mmol/L (ref 96–106)
Creatinine, Ser: 0.72 mg/dL — ABNORMAL LOW (ref 0.76–1.27)
GFR calc Af Amer: 108 mL/min/{1.73_m2} (ref >59)
GFR calc non Af Amer: 94 mL/min/{1.73_m2} (ref >59)
Glucose: 96 mg/dL (ref 65–99)
Potassium: 4.2 mmol/L (ref 3.5–5.2)
Sodium: 137 mmol/L (ref 134–144)

## 2019-07-11 ENCOUNTER — Encounter: Payer: Self-pay | Admitting: Internal Medicine

## 2019-07-11 NOTE — Progress Notes (Signed)
Patient sent letter with normal lab results. He will also be contacted by our staff who can translate this information to him as he does not speak or read english.

## 2019-08-14 NOTE — Congregational Nurse Program (Signed)
CN office visit with interpreter Diu Hartshorn assisting.  Helped patient complete Medicaid application. Bryla Burek O'Brien RN, Congregational Nurse [phone] 

## 2019-10-03 ENCOUNTER — Ambulatory Visit (INDEPENDENT_AMBULATORY_CARE_PROVIDER_SITE_OTHER): Payer: Medicare HMO | Admitting: Internal Medicine

## 2019-10-03 ENCOUNTER — Other Ambulatory Visit: Payer: Self-pay

## 2019-10-03 VITALS — BP 139/65 | HR 86 | Wt 95.5 lb

## 2019-10-03 DIAGNOSIS — I5032 Chronic diastolic (congestive) heart failure: Secondary | ICD-10-CM | POA: Diagnosis not present

## 2019-10-03 DIAGNOSIS — E43 Unspecified severe protein-calorie malnutrition: Secondary | ICD-10-CM | POA: Diagnosis not present

## 2019-10-03 DIAGNOSIS — R5383 Other fatigue: Secondary | ICD-10-CM

## 2019-10-03 DIAGNOSIS — D649 Anemia, unspecified: Secondary | ICD-10-CM

## 2019-10-03 DIAGNOSIS — R630 Anorexia: Secondary | ICD-10-CM

## 2019-10-03 DIAGNOSIS — D509 Iron deficiency anemia, unspecified: Secondary | ICD-10-CM

## 2019-10-03 LAB — POCT GLYCOSYLATED HEMOGLOBIN (HGB A1C): Hemoglobin A1C: 6 % — AB (ref 4.0–5.6)

## 2019-10-03 LAB — GLUCOSE, CAPILLARY: Glucose-Capillary: 110 mg/dL — ABNORMAL HIGH (ref 70–99)

## 2019-10-03 NOTE — Patient Instructions (Signed)
Thank you, [name] for allowing [place] to provide your care today. Today we discussed Fatigue.    I have ordered CBC, CMP, Anmeia panel, SPEP, light chain analysis, Thyroid stimulating hormone  labs for you. I will call if any are abnormal.    I have place a referrals to Gastroenterology for colonoscopy.    Please follow-up in 1 month.    Should you have any questions or concerns please call the internal medicine clinic at [phone].    Dellia Cloud, D.O. [facility]

## 2019-10-03 NOTE — Assessment & Plan Note (Signed)
Patient states that  His weight at his last appointment was: 90 lbs Current weight:  Plan:

## 2019-10-04 LAB — CMP14 + ANION GAP
ALT: 12 IU/L (ref 0–44)
AST: 16 IU/L (ref 0–40)
Albumin/Globulin Ratio: 1.4 (ref 1.2–2.2)
Albumin: 4.3 g/dL (ref 3.7–4.7)
Alkaline Phosphatase: 87 IU/L (ref 48–121)
Anion Gap: 13 mmol/L (ref 10.0–18.0)
BUN/Creatinine Ratio: 18 (ref 10–24)
BUN: 11 mg/dL (ref 8–27)
Bilirubin Total: 0.3 mg/dL (ref 0.0–1.2)
CO2: 25 mmol/L (ref 20–29)
Calcium: 9.2 mg/dL (ref 8.6–10.2)
Chloride: 99 mmol/L (ref 96–106)
Creatinine, Ser: 0.62 mg/dL — ABNORMAL LOW (ref 0.76–1.27)
GFR calc Af Amer: 115 mL/min/{1.73_m2} (ref >59)
GFR calc non Af Amer: 100 mL/min/{1.73_m2} (ref >59)
Globulin, Total: 3 g/dL (ref 1.5–4.5)
Glucose: 108 mg/dL — ABNORMAL HIGH (ref 65–99)
Potassium: 4.6 mmol/L (ref 3.5–5.2)
Sodium: 137 mmol/L (ref 134–144)
Total Protein: 7.3 g/dL (ref 6.0–8.5)

## 2019-10-04 LAB — CBC
Hematocrit: 41.7 % (ref 37.5–51.0)
Hemoglobin: 12.6 g/dL — ABNORMAL LOW (ref 13.0–17.7)
MCH: 23.9 pg — ABNORMAL LOW (ref 26.6–33.0)
MCHC: 30.2 g/dL — ABNORMAL LOW (ref 31.5–35.7)
MCV: 79 fL (ref 79–97)
Platelets: 207 10*3/uL (ref 150–450)
RBC: 5.27 x10E6/uL (ref 4.14–5.80)
RDW: 13 % (ref 11.6–15.4)
WBC: 6.3 10*3/uL (ref 3.4–10.8)

## 2019-10-04 LAB — FERRITIN: Ferritin: 459 ng/mL — ABNORMAL HIGH (ref 30–400)

## 2019-10-04 LAB — IRON AND TIBC
Iron Saturation: 25 % (ref 15–55)
Iron: 68 ug/dL (ref 38–169)
Total Iron Binding Capacity: 270 ug/dL (ref 250–450)
UIBC: 202 ug/dL (ref 111–343)

## 2019-10-04 LAB — PROTEIN ELECTROPHORESIS, SERUM
A/G Ratio: 1 (ref 0.7–1.7)
Albumin ELP: 3.6 g/dL (ref 2.9–4.4)
Alpha 1: 0.2 g/dL (ref 0.0–0.4)
Alpha 2: 0.9 g/dL (ref 0.4–1.0)
Beta: 1.1 g/dL (ref 0.7–1.3)
Gamma Globulin: 1.5 g/dL (ref 0.4–1.8)
Globulin, Total: 3.7 g/dL (ref 2.2–3.9)

## 2019-10-04 LAB — KAPPA/LAMBDA LIGHT CHAINS
Ig Kappa Free Light Chain: 37 mg/L — ABNORMAL HIGH (ref 3.3–19.4)
Ig Lambda Free Light Chain: 26.4 mg/L — ABNORMAL HIGH (ref 5.7–26.3)
KAPPA/LAMBDA RATIO: 1.4 (ref 0.26–1.65)

## 2019-10-04 LAB — TSH: TSH: 1.23 u[IU]/mL (ref 0.450–4.500)

## 2019-10-07 DIAGNOSIS — R5383 Other fatigue: Secondary | ICD-10-CM | POA: Insufficient documentation

## 2019-10-07 NOTE — Progress Notes (Signed)
CC: fatigue  HPI:  [name] is a 74 y.o. male with a past medical history stated below and presents today for fatigue. Please see problem based assessment and plan for additional details.  Past Medical History:  Diagnosis Date  . Acute pulmonary edema (HCC)   . Altered mental status   . Hyperkalemia 07/09/2019    Current Outpatient Medications on File Prior to Visit  Medication Sig Dispense Refill  . acetaminophen (TYLENOL) 325 MG tablet Take 650 mg by mouth every 6 (six) hours as needed.    Marland Kitchen aspirin EC 81 MG tablet Take 81 mg by mouth every 6 (six) hours as needed for moderate pain.    . Multiple Vitamin (MULTIVITAMIN WITH MINERALS) TABS Take 1 tablet by mouth daily.     No current facility-administered medications on file prior to visit.    No family history on file.  Social History   Socioeconomic History  . Marital status: Married    Spouse name: Not on file  . Number of children: Not on file  . Years of education: Not on file  . Highest education level: Not on file  Occupational History  . Not on file  Tobacco Use  . Smoking status: Former Smoker    Packs/day: 1.00    Years: 50.00    Pack years: 50.00    Types: Cigarettes    Quit date: 09/13/2011    Years since quitting: 8.0  . Smokeless tobacco: Never Used  Substance and Sexual Activity  . Alcohol use: Yes  . Drug use: No  . Sexual activity: Not on file  Other Topics Concern  . Not on file  Social History Narrative  . Not on file   Social Determinants of Health   Financial Resource Strain:   . Difficulty of Paying Living Expenses:   Food Insecurity:   . Worried About Charity fundraiser in the Last Year:   . Arboriculturist in the Last Year:   Transportation Needs:   . Film/video editor (Medical):   Marland Kitchen Lack of Transportation (Non-Medical):   Physical Activity:   . Days of Exercise per Week:   . Minutes of Exercise per Session:   Stress:   . Feeling of Stress :   Social  Connections:   . Frequency of Communication with Friends and Family:   . Frequency of Social Gatherings with Friends and Family:   . Attends Religious Services:   . Active Member of Clubs or Organizations:   . Attends Archivist Meetings:   Marland Kitchen Marital Status:   Intimate Partner Violence:   . Fear of Current or Ex-Partner:   . Emotionally Abused:   Marland Kitchen Physically Abused:   . Sexually Abused:     Review of Systems: ROS negative except for what is noted on the assessment and plan.  Vitals:   10/03/19 1530  BP: 139/65  Pulse: 86  SpO2: 100%  Weight: 95 lb 8 oz (43.3 kg)     Physical Exam: Physical Exam Constitutional:      Appearance: Normal appearance.  HENT:     Head: Normocephalic and atraumatic.  Eyes:     Extraocular Movements: Extraocular movements intact.  Cardiovascular:     Rate and Rhythm: Normal rate.     Pulses: Normal pulses.     Heart sounds: Normal heart sounds.  Pulmonary:     Effort: Pulmonary effort is normal.     Breath sounds: Normal breath sounds.  Abdominal:  General: Bowel sounds are normal.     Palpations: Abdomen is soft.     Tenderness: There is no abdominal tenderness.  Musculoskeletal:        General: Normal range of motion.     Cervical back: Normal range of motion.     Right lower leg: No edema.     Left lower leg: No edema.  Skin:    General: Skin is warm and dry.  Neurological:     Mental Status: He is alert and oriented to person, place, and time. Mental status is at baseline.  Psychiatric:        Mood and Affect: Mood normal.      Assessment & Plan:   See Encounters Tab for problem based charting.  Patient discussed with Dr. Jaynie Crumble, D.O. [facility], PGY-1 Pager: [phone], Phone: [phone] Date 10/07/2019 Time 8:34 PM

## 2019-10-07 NOTE — Assessment & Plan Note (Addendum)
Patient presents with a subacute history of fatigue and decreased appetite/early satiety. He states that it all stated when he was admitted for COVID pneumonia in 05/2019. He denies any n/v/d. Denies hematemesis, melena or hematochezia. He does not get any abdominal pain. He states that he simple does not feel like eating and gets full very quickly. He denies recent fever, SHOB, night sweats, or significant weight loss. He does have a history of anemia and has never had a colonoscopy.  Plan: - Order TSH, CMP, CBC, Anemia work-up, multiple myeloma work-up, Hgb A1c. - Send referral for colonoscopy.

## 2019-10-08 NOTE — Addendum Note (Signed)
Addended by: Erlinda Hong T on: 10/08/2019 09:04 AM   Modules accepted: Level of Service

## 2019-10-08 NOTE — Progress Notes (Signed)
Internal Medicine Clinic Attending  Case discussed with Dr. Coe at the time of the visit.  We reviewed the resident's history and exam and pertinent patient test results.  I agree with the assessment, diagnosis, and plan of care documented in the resident's note.    

## 2020-02-25 DIAGNOSIS — H5201 Hypermetropia, right eye: Secondary | ICD-10-CM | POA: Diagnosis not present

## 2020-06-29 ENCOUNTER — Telehealth: Payer: Self-pay

## 2020-06-29 NOTE — Telephone Encounter (Signed)
Please see note already attached when making this appt.  Language Resources has been assigned to interpret, if Interpreter is late call [phone] and use Language Line- [phone]

## 2020-06-29 NOTE — Telephone Encounter (Signed)
Received a TC from a contact Priscille Heidelberg) who is with patient and patients wife and is translating.  States patient is requesting an appt tomorrow to be seen.  States patient c/o ongoing constipation with intermittent abdominal cramping, nausea without vomiting, decreased appetite, and fatigue.  Pt's LOV was 10/03/19 for fatigue/malnutrition.    Appt given for tomorrow morning, red team/ Dr. Cleaster Corin at [phone].  Instructed contact person that if patient starts vomiting or experiencing abdominal pain before appt he should present to urgent care or ED, understanding verbalized. SChaplin, RN,BSN

## 2020-06-30 ENCOUNTER — Other Ambulatory Visit: Payer: Self-pay

## 2020-06-30 ENCOUNTER — Encounter: Payer: Self-pay | Admitting: Internal Medicine

## 2020-06-30 ENCOUNTER — Ambulatory Visit (INDEPENDENT_AMBULATORY_CARE_PROVIDER_SITE_OTHER): Payer: Medicare HMO | Admitting: Internal Medicine

## 2020-06-30 VITALS — BP 165/95 | HR 93 | Temp 97.6°F | Ht 59.0 in | Wt 91.3 lb

## 2020-06-30 DIAGNOSIS — L249 Irritant contact dermatitis, unspecified cause: Secondary | ICD-10-CM

## 2020-06-30 DIAGNOSIS — R7303 Prediabetes: Secondary | ICD-10-CM

## 2020-06-30 DIAGNOSIS — I10 Essential (primary) hypertension: Secondary | ICD-10-CM

## 2020-06-30 HISTORY — DX: Irritant contact dermatitis, unspecified cause: L24.9

## 2020-06-30 MED ORDER — LOSARTAN POTASSIUM 25 MG PO TABS: 25.0000 mg | ORAL_TABLET | Freq: Every day | ORAL | 0 refills | Status: DC

## 2020-06-30 MED ORDER — BETAMETHASONE DIPROPIONATE AUG 0.05 % EX CREA: TOPICAL_CREAM | Freq: Two times a day (BID) | CUTANEOUS | 0 refills | Status: DC

## 2020-06-30 NOTE — Assessment & Plan Note (Signed)
BP Readings from Last 3 Encounters:  06/30/20 (!) 165/95  10/03/19 139/65  06/28/19 (!) 142/68   Blood pressure elevatedx2. Reviewing chart it appears he frequently has elevated blood pressure.   - check BMP - start losartan 25 mg qd - f/u in one week

## 2020-06-30 NOTE — Progress Notes (Signed)
   CC: rash  HPI:  [name] is a 75 y.o. with PMH as below.   Please see A&P for assessment of the patient's acute and chronic medical conditions.   Past Medical History:  Diagnosis Date  . Acute pulmonary edema (HCC)   . Altered mental status   . Hyperkalemia 07/09/2019   Burning, itching rash present for the last four days that is periumbilical that appeared suddenly. He has scratched the area raw and feels like his skin has thickened in some areas as a result. He has been using an antifungal cream which has not seemed to help. He also has some itching over the arms and legs although this is not nearly as bad. He does not know of any new exposure to soaps or detergents and has not worn new clothes. Communication was somewhat limited by language barrier despite translator best efforts. The patient did recently attend a funeral which is a multiday event where he stayed with friends, built the [employer] and cooked food for funeral event.  Review of Systems:   10 point ROS negative except as noted in HPI  Physical Exam:   Constitution: NAD, appears stated age HENT: [place]/AT Eyes: no icterus or injection  Cardio: RRR, no m/r/g, no LE edema  Abdominal: NTTP, soft, non-distended MSK: moving all extremities Neuro: normal affect, a&ox3 Skin: dry skin over arms and legs with some flaking, periumbilical rash with lichenification, erythema and abrasions from scratching   Vitals:   06/30/20 0855 06/30/20 0912  BP: (!) 169/87 (!) 165/95  Pulse: 96 93  Temp: 97.6 F (36.4 C)   TempSrc: Oral   SpO2: 99%   Weight: 91 lb 4.8 oz (41.4 kg)   Height: 4\' 11"  (1.499 m)      Assessment & Plan:   See Encounters Tab for problem based charting.  Patient discussed with Dr. 

## 2020-06-30 NOTE — Assessment & Plan Note (Signed)
Last a1c 6 in 10/03/2019  - check a1c

## 2020-06-30 NOTE — Patient Instructions (Addendum)
Please start taking:  Losartan 25 mg - take one tablet per day    Please use betamethasone cream on your stomach two times per day for two weeks. Do not use in other areas of the body. Do not use for more than two weeks.   Please apply Sarna Cream to arms and legs as needed for dryness and itching.  Please follow-up in one week to assess rash and blood pressure check.

## 2020-06-30 NOTE — Assessment & Plan Note (Signed)
Burning, itching rash present for the last four days that is periumbilical that appeared suddenly. He has scratched the area raw and feels like his skin has thickened in some areas as a result. He has been using an antifungal cream which has not seemed to help. He also has some itching over the arms and legs although this is not nearly as bad. He does not know of any new exposure to soaps or detergents and has not worn new clothes. Communication was somewhat limited by language barrier despite translator best efforts. The patient did recently attend a funeral which is a multiday event where he stayed with friends, built the [employer] and cooked food for funeral event. His rash does appear to be a severe contact dermatitis and he has areas of dry skin over his legs and arms.   - use only soaps for sensitive skin and without fragrance - betamethasone dipropionate cream (ointment not covered by insurance) .05% for two weeks to area of abdomen  - Sarna to arms and legs - advised patient to be aware of any new clothes or detergents or soaps that may be irritating his skin - f/u in one week to assess improvement

## 2020-07-01 LAB — BMP8+ANION GAP
Anion Gap: 17 mmol/L (ref 10.0–18.0)
BUN/Creatinine Ratio: 10 (ref 10–24)
BUN: 7 mg/dL — ABNORMAL LOW (ref 8–27)
CO2: 23 mmol/L (ref 20–29)
Calcium: 9.5 mg/dL (ref 8.6–10.2)
Chloride: 106 mmol/L (ref 96–106)
Creatinine, Ser: 0.72 mg/dL — ABNORMAL LOW (ref 0.76–1.27)
Glucose: 88 mg/dL (ref 65–99)
Potassium: 5 mmol/L (ref 3.5–5.2)
Sodium: 146 mmol/L — ABNORMAL HIGH (ref 134–144)
eGFR: 97 mL/min/{1.73_m2} (ref >59)

## 2020-07-01 LAB — HEMOGLOBIN A1C
Est. average glucose Bld gHb Est-mCnc: 114 mg/dL
Hgb A1c MFr Bld: 5.6 % (ref 4.8–5.6)

## 2020-07-07 ENCOUNTER — Other Ambulatory Visit: Payer: Self-pay

## 2020-07-07 ENCOUNTER — Other Ambulatory Visit: Payer: Self-pay | Admitting: Internal Medicine

## 2020-07-07 ENCOUNTER — Ambulatory Visit (INDEPENDENT_AMBULATORY_CARE_PROVIDER_SITE_OTHER): Payer: Medicare HMO | Admitting: Internal Medicine

## 2020-07-07 ENCOUNTER — Encounter: Payer: Self-pay | Admitting: Internal Medicine

## 2020-07-07 VITALS — BP 157/83 | HR 73 | Temp 97.9°F | Ht 59.0 in | Wt 93.5 lb

## 2020-07-07 DIAGNOSIS — L249 Irritant contact dermatitis, unspecified cause: Secondary | ICD-10-CM

## 2020-07-07 DIAGNOSIS — I10 Essential (primary) hypertension: Secondary | ICD-10-CM | POA: Diagnosis not present

## 2020-07-07 DIAGNOSIS — R1314 Dysphagia, pharyngoesophageal phase: Secondary | ICD-10-CM | POA: Diagnosis not present

## 2020-07-07 DIAGNOSIS — G479 Sleep disorder, unspecified: Secondary | ICD-10-CM | POA: Diagnosis not present

## 2020-07-07 DIAGNOSIS — R1310 Dysphagia, unspecified: Secondary | ICD-10-CM | POA: Insufficient documentation

## 2020-07-07 MED ORDER — LOSARTAN POTASSIUM 25 MG PO TABS: ORAL_TABLET | ORAL | 5 refills | Status: DC

## 2020-07-07 MED ORDER — LOSARTAN POTASSIUM 25 MG PO TABS: 50.0000 mg | ORAL_TABLET | Freq: Every day | ORAL | 0 refills | Status: DC

## 2020-07-07 MED ORDER — MELATONIN 1 MG PO CHEW: 1.0000 | CHEWABLE_TABLET | Freq: Every day | ORAL | 2 refills | Status: DC

## 2020-07-07 NOTE — Assessment & Plan Note (Signed)
BP Readings from Last 3 Encounters:  07/07/20 (!) 157/83  06/30/20 (!) 165/95  10/03/19 139/65   Blood pressure continues to be elevated although decreased from prior. He has been taking the losartan 25 mg daily. No chest pain or shortness of breath.   - increase to 50 mg qd  - f/u in one month for bp check

## 2020-07-07 NOTE — Progress Notes (Signed)
Internal Medicine Clinic Attending  Case discussed with Dr. Seawell  At the time of the visit.  We reviewed the resident's history and exam and pertinent patient test results.  I agree with the assessment, diagnosis, and plan of care documented in the resident's note.  

## 2020-07-07 NOTE — Assessment & Plan Note (Signed)
His rash has resolved and the itching is gone.

## 2020-07-07 NOTE — Progress Notes (Signed)
   CC: hypertension  HPI:  John Luna is a 75 y.o. with PMH as below.   Please see A&P for assessment of the patient's acute and chronic medical conditions.   Blood pressure continues to be elevated although decreased from prior. He has been taking the losartan 25 mg daily. No chest pain or shortness of breath.   He has had difficulty swallowing recently but cannot remember exactly how long. This happens with both solids and liquids and seems to be worsening. He tries to swallow the food and then often coughs it back up. No nausea or abdominal pain. No feeling of a mass or lump in his throat. He denies fever, chills, or weight loss. This has never happened before.   Past Medical History:  Diagnosis Date  . Acute pulmonary edema (HCC)   . Altered mental status   . Hyperkalemia 07/09/2019  . Irritant contact dermatitis 06/30/2020   Review of Systems:   10 point ROS negative except as noted in HPI  Physical Exam: Constitution: NAD, appears stated age HENT: no pharyngeal erythema, moist mucous membranes, no palpable mass or thyroid, no cervical lymphadenopathy  Cardio: RRR, no m/r/g, no LE edema  Respiratory: CTA, no w/r/r Abdominal: NTTP, soft, non-distended, NTTP  Neuro: normal affect, a&o Skin: c/d/i    Vitals:   07/07/20 0834 07/07/20 0850 07/07/20 0916  BP: (!) 186/88 (!) 185/87 (!) 157/83  Pulse: 81  73  Temp: 97.9 F (36.6 C)    TempSrc: Oral    SpO2: 97%    Weight: 93 lb 8 oz (42.4 kg)    Height: 4\' 11"  (1.499 m)      Assessment & Plan:   See Encounters Tab for problem based charting.  Patient seen with Dr. 

## 2020-07-07 NOTE — Assessment & Plan Note (Signed)
He has had difficulty swallowing recently but cannot remember exactly how long. This happens with both solids and liquids and seems to be worsening. He tries to swallow the food and then often coughs it back up. No nausea or abdominal pain. No feeling of a mass or lump in his throat. He denies fever, chills, or weight loss. This has never happened before.  Chart review, no weight loss noted. Physical exam benign.   - esophogram ordered

## 2020-07-07 NOTE — Patient Instructions (Signed)
Thank you for allowing [place] to provide your care today. Today we discussed your difficulty swallowing and elevated blood pressure and difficulty sleeping.    Today we made the following changes to your medications:   Please START taking  Losartan 25 mg - take 2 tablets per day   Melatonin - take 1 tablet per day   Please follow-up with gastroenterology    Please follow-up in one month.    Please call the internal medicine center clinic if you have any questions or concerns, we may be able to help and keep you from a long and expensive emergency room wait. Our clinic and after hours phone number is [phone], the best time to call is Monday through Friday 9 am to 4 pm but there is always someone available 24/7 if you have an emergency. If you need medication refills please notify your pharmacy one week in advance and they will send [place] a request.

## 2020-07-08 NOTE — Progress Notes (Signed)
Internal Medicine Clinic Attending  Case discussed with Dr. Cleaster Corin  At the time of the visit.  We reviewed the resident's history and exam and pertinent patient test results.  I agree with the assessment, diagnosis, and plan of care documented in the resident's note.  Barium esophagram will help distinguish a functional/peristaltic vs a structural esophageal abnormality prior to GI referral.

## 2020-07-28 ENCOUNTER — Other Ambulatory Visit: Payer: Self-pay | Admitting: Internal Medicine

## 2020-07-28 DIAGNOSIS — I10 Essential (primary) hypertension: Secondary | ICD-10-CM

## 2020-08-10 ENCOUNTER — Encounter: Payer: Self-pay | Admitting: Internal Medicine

## 2020-08-10 ENCOUNTER — Other Ambulatory Visit: Payer: Self-pay

## 2020-08-10 ENCOUNTER — Ambulatory Visit (INDEPENDENT_AMBULATORY_CARE_PROVIDER_SITE_OTHER): Payer: Medicare HMO | Admitting: Internal Medicine

## 2020-08-10 DIAGNOSIS — I10 Essential (primary) hypertension: Secondary | ICD-10-CM | POA: Diagnosis not present

## 2020-08-10 MED ORDER — LOSARTAN POTASSIUM 25 MG PO TABS
ORAL_TABLET | ORAL | 5 refills | Status: DC
Start: 2020-08-10 — End: 2020-08-10

## 2020-08-10 MED ORDER — LOSARTAN POTASSIUM 50 MG PO TABS
50.0000 mg | ORAL_TABLET | Freq: Every day | ORAL | 5 refills | Status: DC
Start: 2020-08-10 — End: 2021-01-25

## 2020-08-10 NOTE — Assessment & Plan Note (Signed)
Patient presents for reevaluation of hypertension.  Patient's blood pressure today is 144/71.  He states that he ran out of his blood pressure medication roughly a week ago.  He states that he tolerates this medication well without any side effects.  He does state that he walks almost an hour every morning with occasional walks in the evening depending on his schedule.    Assessment: Uncontrolled hypertension due to absence of refills available to the patient.    Plan: 1. Refill losartan but at 50 mg daily 2. Follow-up in 4 to 6 months.

## 2020-08-10 NOTE — Progress Notes (Signed)
CC: HTN  HPI:  [name] is a 75 y.o. male with a past medical history stated below and presents today for HTN. Please see problem based assessment and plan for additional details.  Past Medical History:  Diagnosis Date  . Acute pulmonary edema (HCC)   . Altered mental status   . Hyperkalemia 07/09/2019  . Irritant contact dermatitis 06/30/2020    Current Outpatient Medications on File Prior to Visit  Medication Sig Dispense Refill  . acetaminophen (TYLENOL) 325 MG tablet Take 650 mg by mouth every 6 (six) hours as needed.    Marland Kitchen aspirin EC 81 MG tablet Take 81 mg by mouth every 6 (six) hours as needed for moderate pain.    Marland Kitchen augmented betamethasone dipropionate (DIPROLENE-AF) 0.05 % cream Apply topically 2 (two) times daily. 30 g 0  . Melatonin 1 MG CHEW Chew 1 tablet by mouth daily. 30 tablet 2  . Multiple Vitamin (MULTIVITAMIN WITH MINERALS) TABS Take 1 tablet by mouth daily.     No current facility-administered medications on file prior to visit.    No family history on file.  Social History   Socioeconomic History  . Marital status: Married    Spouse name: Not on file  . Number of children: Not on file  . Years of education: Not on file  . Highest education level: Not on file  Occupational History  . Not on file  Tobacco Use  . Smoking status: Former Smoker    Packs/day: 1.00    Years: 50.00    Pack years: 50.00    Types: Cigarettes    Quit date: 09/13/2011    Years since quitting: 8.9  . Smokeless tobacco: Never Used  Substance and Sexual Activity  . Alcohol use: Yes  . Drug use: No  . Sexual activity: Not on file  Other Topics Concern  . Not on file  Social History Narrative  . Not on file   Social Determinants of Health   Financial Resource Strain: Not on file  Food Insecurity: Not on file  Transportation Needs: Not on file  Physical Activity: Not on file  Stress: Not on file  Social Connections: Not on file  Intimate Partner Violence: Not on  file    Review of Systems: ROS negative except for what is noted on the assessment and plan.  Vitals:   08/10/20 0838  BP: (!) 144/71  Pulse: 88  Temp: 98.2 F (36.8 C)  TempSrc: Oral  SpO2: 100%  Weight: 91 lb 14.4 oz (41.7 kg)    Physical Exam: Gen: A&O x3 and in no apparent distress, well appearing and nourished. HEENT: Head - normocephalic, atraumatic. Eye -  visual acuity grossly intact, conjunctiva clear, sclera non-icteric, EOM intact. Mouth - No obvious caries or periodontal disease. Neck: no obvious masses or nodules, AROM intact. CV: RRR, no murmurs, rubs, or gallops. S1/S2 presents  Resp: Clear to ascultation bilaterally  Abd: BS (+) x4, soft, non-tender, without obvious hepatosplenomegaly or masses MSK: Grossly normal AROM and strength x4 extremities. Skin: good skin turgor, no rashes, unusual bruising, or prominent lesions.  Neuro: No focal deficits, grossly normal sensation and coordination.  Psych: Oriented x3 and responding appropriately. Intact recent and remote memory, normal mood, judgement, affect , and insight.    Assessment & Plan:   See Encounters Tab for problem based charting.  Patient discussed with Dr. Sarina Ill, D.O. [facility], PGY-2 Pager: [phone], Phone: [phone] Date 08/10/2020 Time 9:16  AM

## 2020-08-10 NOTE — Patient Instructions (Signed)
Thank you, JohnJohn Luna for allowing [place] to provide your care today. Today we discussed Blood pressure.    I have ordered the following labs for you:  Lab Orders  No laboratory test(s) ordered today     Tests ordered today:  none  Referrals ordered today:   Referral Orders  No referral(s) requested today     Medication Changes:   Medications Discontinued During This Encounter  Medication Reason  . losartan (COZAAR) 25 MG tablet Reorder     Meds ordered this encounter  Medications  . losartan (COZAAR) 25 MG tablet    Sig: TAKE 2 TABLETS(50 MG) BY MOUTH DAILY    Dispense:  60 tablet    Refill:  5     Instructions:  Keep up the good work!  Follow up: 4-6 months   Remember:    Should you have any questions or concerns please call the internal medicine clinic at [phone].     Dellia Cloud, D.O. [facility]

## 2020-08-12 NOTE — Progress Notes (Signed)
Internal Medicine Clinic Attending  Case discussed with Dr. Coe  At the time of the visit.  We reviewed the resident's history and exam and pertinent patient test results.  I agree with the assessment, diagnosis, and plan of care documented in the resident's note.  

## 2020-08-27 ENCOUNTER — Encounter: Payer: Self-pay | Admitting: *Deleted

## 2020-08-27 NOTE — Progress Notes (Signed)

## 2020-08-27 NOTE — Progress Notes (Signed)
Things That May Be Affecting Your Health:  Alcohol  Hearing loss  Pain    Depression  Home Safety  Sexual Health  x Diabetes  Lack of physical activity  Stress   Difficulty with daily activities  Loneliness  Tiredness   Drug use  Medicines x Tobacco use   Falls  Motor Vehicle Safety  Weight   Food choices  Oral Health  Other    YOUR PERSONALIZED HEALTH PLAN : 1. Schedule your next subsequent Medicare Wellness visit in one year 2. Attend all of your regular appointments to address your medical issues 3. Complete the preventative screenings and services   Annual Wellness Visit   Medicare Covered Preventative Screenings and Services  Services & Screenings Men and Women Who How Often Need? Date of Last Service Action  Abdominal Aortic Aneurysm Adults with AAA risk factors Once      Alcohol Misuse and Counseling All Adults Screening once a year if no alcohol misuse. Counseling up to 4 face to face sessions.     Bone Density Measurement  Adults at risk for osteoporosis Once every 2 yrs      Lipid Panel Z13.6 All adults without CV disease Once every 5 yrs       Colorectal Cancer   Stool sample or  Colonoscopy All adults 50 and older   Once every year  Every 10 years x       Depression All Adults Once a year  Today   Diabetes Screening Blood glucose, post glucose load, or GTT Z13.1  All adults at risk  Pre-diabetics  Once per year  Twice per year      Diabetes  Self-Management Training All adults Diabetics 10 hrs first year; 2 hours subsequent years. Requires Copay     Glaucoma  Diabetics  Family history of glaucoma  African Americans 50 yrs +  Hispanic Americans 65 yrs + Annually - requires coppay      Hepatitis C Z72.89 or F19.20  High Risk for HCV  Born between 1945 and 1965  Annually  Once      HIV Z11.4 All adults based on risk  Annually btw ages 27 & 28 regardless of risk  Annually > 65 yrs if at increased risk      Lung Cancer Screening  Asymptomatic adults aged 75-77 with 30 pack yr history and current smoker OR quit within the last 15 yrs Annually Must have counseling and shared decision making documentation before first screen      Medical Nutrition Therapy Adults with   Diabetes  Renal disease  Kidney transplant within past 3 yrs 3 hours first year; 2 hours subsequent years     Obesity and Counseling All adults Screening once a year Counseling if BMI 30 or higher  Today   Tobacco Use Counseling Adults who use tobacco  Up to 8 visits in one year x    Vaccines Z23  Hepatitis B  Influenza   Pneumonia  Adults   Once  Once every flu season  Two different vaccines separated by one year x    Next Annual Wellness Visit People with Medicare Every year  Today     Services & Screenings Women Who How Often Need  Date of Last Service Action  Mammogram  Z12.31 Women over 40 One baseline ages 67-39. Annually ager 40 yrs+      Pap tests All women Annually if high risk. Every 2 yrs for normal risk women  Screening for cervical cancer with   Pap (Z01.419 nl or Z01.411abnl) &  HPV Z11.51 Women aged 49 to 9 Once every 5 yrs     Screening pelvic and breast exams All women Annually if high risk. Every 2 yrs for normal risk women     Sexually Transmitted Diseases  Chlamydia  Gonorrhea  Syphilis All at risk adults Annually for non pregnant females at increased risk         Services & Screenings Men Who How Ofter Need  Date of Last Service Action  Prostate Cancer - DRE & PSA Men over 50 Annually.  DRE might require a copay.        Sexually Transmitted Diseases  Syphilis All at risk adults Annually for men at increased risk      Health Maintenance List Health Maintenance  Topic Date Due  . TETANUS/TDAP  Never done  . COLONOSCOPY (Pts 45-53yrs Insurance coverage will need to be confirmed)  Never done  . PNA vac Low Risk Adult (1 of 2 - PCV13) Never done  . COVID-19 Vaccine (3 - Booster for Pfizer  series) 03/11/2020  . INFLUENZA VACCINE  11/23/2020  . Hepatitis C Screening  Completed  . HPV VACCINES  Aged Out

## 2020-10-05 ENCOUNTER — Other Ambulatory Visit: Payer: Self-pay | Admitting: Internal Medicine

## 2020-10-05 DIAGNOSIS — L249 Irritant contact dermatitis, unspecified cause: Secondary | ICD-10-CM

## 2020-12-20 ENCOUNTER — Emergency Department (HOSPITAL_COMMUNITY): Payer: Medicare HMO

## 2020-12-20 ENCOUNTER — Emergency Department (HOSPITAL_COMMUNITY)
Admission: EM | Admit: 2020-12-20 | Discharge: 2020-12-20 | Disposition: A | Discharge status: Home / Self Care | Payer: Medicare HMO | Attending: Emergency Medicine | Admitting: Emergency Medicine

## 2020-12-20 ENCOUNTER — Encounter (HOSPITAL_COMMUNITY): Payer: Self-pay

## 2020-12-20 ENCOUNTER — Other Ambulatory Visit: Payer: Self-pay

## 2020-12-20 DIAGNOSIS — S3991XA Unspecified injury of abdomen, initial encounter: Secondary | ICD-10-CM | POA: Diagnosis not present

## 2020-12-20 DIAGNOSIS — Z87891 Personal history of nicotine dependence: Secondary | ICD-10-CM | POA: Diagnosis not present

## 2020-12-20 DIAGNOSIS — Z20822 Contact with and (suspected) exposure to covid-19: Secondary | ICD-10-CM | POA: Insufficient documentation

## 2020-12-20 DIAGNOSIS — M47812 Spondylosis without myelopathy or radiculopathy, cervical region: Secondary | ICD-10-CM | POA: Diagnosis not present

## 2020-12-20 DIAGNOSIS — R1084 Generalized abdominal pain: Secondary | ICD-10-CM | POA: Insufficient documentation

## 2020-12-20 DIAGNOSIS — J449 Chronic obstructive pulmonary disease, unspecified: Secondary | ICD-10-CM | POA: Insufficient documentation

## 2020-12-20 DIAGNOSIS — S299XXA Unspecified injury of thorax, initial encounter: Secondary | ICD-10-CM | POA: Diagnosis not present

## 2020-12-20 DIAGNOSIS — I5032 Chronic diastolic (congestive) heart failure: Secondary | ICD-10-CM | POA: Insufficient documentation

## 2020-12-20 DIAGNOSIS — M79642 Pain in left hand: Secondary | ICD-10-CM | POA: Diagnosis not present

## 2020-12-20 DIAGNOSIS — R0789 Other chest pain: Secondary | ICD-10-CM | POA: Diagnosis not present

## 2020-12-20 DIAGNOSIS — Z041 Encounter for examination and observation following transport accident: Secondary | ICD-10-CM | POA: Diagnosis not present

## 2020-12-20 DIAGNOSIS — Z79899 Other long term (current) drug therapy: Secondary | ICD-10-CM | POA: Diagnosis not present

## 2020-12-20 DIAGNOSIS — E871 Hypo-osmolality and hyponatremia: Secondary | ICD-10-CM | POA: Insufficient documentation

## 2020-12-20 DIAGNOSIS — I110 Hypertensive heart disease with heart failure: Secondary | ICD-10-CM | POA: Insufficient documentation

## 2020-12-20 DIAGNOSIS — M25532 Pain in left wrist: Secondary | ICD-10-CM | POA: Diagnosis not present

## 2020-12-20 DIAGNOSIS — S199XXA Unspecified injury of neck, initial encounter: Secondary | ICD-10-CM | POA: Diagnosis not present

## 2020-12-20 DIAGNOSIS — R4182 Altered mental status, unspecified: Secondary | ICD-10-CM | POA: Insufficient documentation

## 2020-12-20 DIAGNOSIS — Y92410 Unspecified street and highway as the place of occurrence of the external cause: Secondary | ICD-10-CM | POA: Diagnosis not present

## 2020-12-20 DIAGNOSIS — R52 Pain, unspecified: Secondary | ICD-10-CM

## 2020-12-20 DIAGNOSIS — Y900 Blood alcohol level of less than 20 mg/100 ml: Secondary | ICD-10-CM | POA: Insufficient documentation

## 2020-12-20 DIAGNOSIS — M4312 Spondylolisthesis, cervical region: Secondary | ICD-10-CM | POA: Diagnosis not present

## 2020-12-20 DIAGNOSIS — S0990XA Unspecified injury of head, initial encounter: Secondary | ICD-10-CM | POA: Diagnosis not present

## 2020-12-20 DIAGNOSIS — G319 Degenerative disease of nervous system, unspecified: Secondary | ICD-10-CM | POA: Diagnosis not present

## 2020-12-20 DIAGNOSIS — R0902 Hypoxemia: Secondary | ICD-10-CM | POA: Diagnosis not present

## 2020-12-20 DIAGNOSIS — R079 Chest pain, unspecified: Secondary | ICD-10-CM | POA: Diagnosis not present

## 2020-12-20 LAB — BASIC METABOLIC PANEL
Anion gap: 7 (ref 5–15)
BUN: 5 mg/dL — ABNORMAL LOW (ref 8–23)
CO2: 24 mmol/L (ref 22–32)
Calcium: 8.7 mg/dL — ABNORMAL LOW (ref 8.9–10.3)
Chloride: 96 mmol/L — ABNORMAL LOW (ref 98–111)
Creatinine, Ser: 0.64 mg/dL (ref 0.61–1.24)
GFR, Estimated: >60 mL/min (ref >60)
Glucose, Bld: 140 mg/dL — ABNORMAL HIGH (ref 70–99)
Potassium: 3.9 mmol/L (ref 3.5–5.1)
Sodium: 127 mmol/L — ABNORMAL LOW (ref 135–145)

## 2020-12-20 LAB — CBC
HCT: 38.9 % — ABNORMAL LOW (ref 39.0–52.0)
Hemoglobin: 12.8 g/dL — ABNORMAL LOW (ref 13.0–17.0)
MCH: 25.5 pg — ABNORMAL LOW (ref 26.0–34.0)
MCHC: 32.9 g/dL (ref 30.0–36.0)
MCV: 77.6 fL — ABNORMAL LOW (ref 80.0–100.0)
Platelets: 247 10*3/uL (ref 150–400)
RBC: 5.01 MIL/uL (ref 4.22–5.81)
RDW: 12.5 % (ref 11.5–15.5)
WBC: 4.9 10*3/uL (ref 4.0–10.5)
nRBC: 0 % (ref 0.0–0.2)

## 2020-12-20 LAB — I-STAT CHEM 8, ED
BUN: 6 mg/dL — ABNORMAL LOW (ref 8–23)
Calcium, Ion: 0.94 mmol/L — ABNORMAL LOW (ref 1.15–1.40)
Chloride: 99 mmol/L (ref 98–111)
Creatinine, Ser: 0.5 mg/dL — ABNORMAL LOW (ref 0.61–1.24)
Glucose, Bld: 111 mg/dL — ABNORMAL HIGH (ref 70–99)
HCT: 43 % (ref 39.0–52.0)
Hemoglobin: 14.6 g/dL (ref 13.0–17.0)
Potassium: 5.8 mmol/L — ABNORMAL HIGH (ref 3.5–5.1)
Sodium: 127 mmol/L — ABNORMAL LOW (ref 135–145)
TCO2: 24 mmol/L (ref 22–32)

## 2020-12-20 LAB — HEPATIC FUNCTION PANEL
ALT: 12 U/L (ref 0–44)
AST: 21 U/L (ref 15–41)
Albumin: 2.6 g/dL — ABNORMAL LOW (ref 3.5–5.0)
Alkaline Phosphatase: 71 U/L (ref 38–126)
Bilirubin, Direct: 0.2 mg/dL (ref 0.0–0.2)
Indirect Bilirubin: 0.7 mg/dL (ref 0.3–0.9)
Total Bilirubin: 0.9 mg/dL (ref 0.3–1.2)
Total Protein: 6.5 g/dL (ref 6.5–8.1)

## 2020-12-20 LAB — RESP PANEL BY RT-PCR (FLU A&B, COVID) ARPGX2
Influenza A by PCR: NEGATIVE
Influenza B by PCR: NEGATIVE
SARS Coronavirus 2 by RT PCR: NEGATIVE

## 2020-12-20 LAB — SAMPLE TO BLOOD BANK

## 2020-12-20 LAB — LACTIC ACID, PLASMA: Lactic Acid, Venous: 1.8 mmol/L (ref 0.5–1.9)

## 2020-12-20 LAB — TROPONIN I (HIGH SENSITIVITY)
Troponin I (High Sensitivity): 7 ng/L (ref <18)
Troponin I (High Sensitivity): 8 ng/L (ref <18)

## 2020-12-20 LAB — PROTIME-INR
INR: 0.9 (ref 0.8–1.2)
Prothrombin Time: 12.6 seconds (ref 11.4–15.2)

## 2020-12-20 LAB — ETHANOL: Alcohol, Ethyl (B): <10 mg/dL (ref <10)

## 2020-12-20 MED ORDER — IOHEXOL 350 MG/ML SOLN
75.0000 mL | Freq: Once | INTRAVENOUS | Status: AC | PRN
  Administered 2020-12-20: 75 mL via INTRAVENOUS

## 2020-12-20 NOTE — ED Notes (Signed)
Son at bedside.

## 2020-12-20 NOTE — Discharge Instructions (Addendum)
You will need to have a repeat sodium done because the value today was 127.  Follow-up with your doctor or call the community wellness center to schedule a visit.  If you are unsuccessful with that, return here for a repeat sodium in 3 days
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
## 2021-01-20 ENCOUNTER — Encounter (HOSPITAL_COMMUNITY): Payer: Self-pay | Admitting: Radiology

## 2021-01-25 ENCOUNTER — Ambulatory Visit (INDEPENDENT_AMBULATORY_CARE_PROVIDER_SITE_OTHER): Payer: Medicare HMO | Admitting: Primary Care

## 2021-01-25 ENCOUNTER — Encounter (INDEPENDENT_AMBULATORY_CARE_PROVIDER_SITE_OTHER): Payer: Self-pay | Admitting: Primary Care

## 2021-01-25 ENCOUNTER — Other Ambulatory Visit: Payer: Self-pay

## 2021-01-25 VITALS — BP 172/80 | HR 76 | Temp 97.3°F | Ht 59.0 in | Wt 91.0 lb

## 2021-01-25 DIAGNOSIS — F1010 Alcohol abuse, uncomplicated: Secondary | ICD-10-CM

## 2021-01-25 DIAGNOSIS — Z7689 Persons encountering health services in other specified circumstances: Secondary | ICD-10-CM

## 2021-01-25 DIAGNOSIS — Z23 Encounter for immunization: Secondary | ICD-10-CM | POA: Diagnosis not present

## 2021-01-25 DIAGNOSIS — I10 Essential (primary) hypertension: Secondary | ICD-10-CM

## 2021-01-25 DIAGNOSIS — Z1211 Encounter for screening for malignant neoplasm of colon: Secondary | ICD-10-CM

## 2021-01-25 DIAGNOSIS — Z681 Body mass index (BMI) 19 or less, adult: Secondary | ICD-10-CM

## 2021-01-25 MED ORDER — LOSARTAN POTASSIUM 50 MG PO TABS: 50.0000 mg | ORAL_TABLET | Freq: Every day | ORAL | 1 refills | Status: DC
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
## 2021-03-08 ENCOUNTER — Ambulatory Visit (INDEPENDENT_AMBULATORY_CARE_PROVIDER_SITE_OTHER): Payer: Medicare HMO | Admitting: Primary Care

## 2021-04-13 ENCOUNTER — Ambulatory Visit (INDEPENDENT_AMBULATORY_CARE_PROVIDER_SITE_OTHER): Payer: Medicare HMO | Admitting: Primary Care

## 2021-07-02 ENCOUNTER — Other Ambulatory Visit: Payer: Self-pay

## 2021-07-02 ENCOUNTER — Emergency Department (HOSPITAL_COMMUNITY)
Admission: EM | Admit: 2021-07-02 | Discharge: 2021-07-02 | Disposition: A | Discharge status: Home / Self Care | Payer: Medicare HMO | Attending: Emergency Medicine | Admitting: Emergency Medicine

## 2021-07-02 ENCOUNTER — Emergency Department (HOSPITAL_COMMUNITY): Payer: Medicare HMO

## 2021-07-02 ENCOUNTER — Encounter (HOSPITAL_COMMUNITY): Payer: Self-pay | Admitting: Emergency Medicine

## 2021-07-02 DIAGNOSIS — M47812 Spondylosis without myelopathy or radiculopathy, cervical region: Secondary | ICD-10-CM | POA: Diagnosis not present

## 2021-07-02 DIAGNOSIS — Z79899 Other long term (current) drug therapy: Secondary | ICD-10-CM | POA: Diagnosis not present

## 2021-07-02 DIAGNOSIS — K047 Periapical abscess without sinus: Secondary | ICD-10-CM | POA: Insufficient documentation

## 2021-07-02 DIAGNOSIS — R519 Headache, unspecified: Secondary | ICD-10-CM | POA: Diagnosis not present

## 2021-07-02 DIAGNOSIS — I10 Essential (primary) hypertension: Secondary | ICD-10-CM | POA: Insufficient documentation

## 2021-07-02 DIAGNOSIS — S0990XA Unspecified injury of head, initial encounter: Secondary | ICD-10-CM | POA: Diagnosis present

## 2021-07-02 DIAGNOSIS — Z7982 Long term (current) use of aspirin: Secondary | ICD-10-CM | POA: Insufficient documentation

## 2021-07-02 DIAGNOSIS — X58XXXA Exposure to other specified factors, initial encounter: Secondary | ICD-10-CM | POA: Diagnosis not present

## 2021-07-02 DIAGNOSIS — Z20822 Contact with and (suspected) exposure to covid-19: Secondary | ICD-10-CM | POA: Diagnosis not present

## 2021-07-02 DIAGNOSIS — I700 Atherosclerosis of aorta: Secondary | ICD-10-CM | POA: Diagnosis not present

## 2021-07-02 DIAGNOSIS — J029 Acute pharyngitis, unspecified: Secondary | ICD-10-CM | POA: Diagnosis not present

## 2021-07-02 DIAGNOSIS — M4312 Spondylolisthesis, cervical region: Secondary | ICD-10-CM | POA: Diagnosis not present

## 2021-07-02 DIAGNOSIS — R0981 Nasal congestion: Secondary | ICD-10-CM | POA: Insufficient documentation

## 2021-07-02 DIAGNOSIS — S0003XA Contusion of scalp, initial encounter: Secondary | ICD-10-CM | POA: Insufficient documentation

## 2021-07-02 LAB — I-STAT CHEM 8, ED
BUN: 8 mg/dL (ref 8–23)
Calcium, Ion: 1.12 mmol/L — ABNORMAL LOW (ref 1.15–1.40)
Chloride: 101 mmol/L (ref 98–111)
Creatinine, Ser: 0.9 mg/dL (ref 0.61–1.24)
Glucose, Bld: 91 mg/dL (ref 70–99)
HCT: 44 % (ref 39.0–52.0)
Hemoglobin: 15 g/dL (ref 13.0–17.0)
Potassium: 4.4 mmol/L (ref 3.5–5.1)
Sodium: 136 mmol/L (ref 135–145)
TCO2: 28 mmol/L (ref 22–32)

## 2021-07-02 LAB — COMPREHENSIVE METABOLIC PANEL
ALT: 13 U/L (ref 0–44)
AST: 26 U/L (ref 15–41)
Albumin: 3.8 g/dL (ref 3.5–5.0)
Alkaline Phosphatase: 74 U/L (ref 38–126)
Anion gap: 11 (ref 5–15)
BUN: 8 mg/dL (ref 8–23)
CO2: 25 mmol/L (ref 22–32)
Calcium: 8.8 mg/dL — ABNORMAL LOW (ref 8.9–10.3)
Chloride: 97 mmol/L — ABNORMAL LOW (ref 98–111)
Creatinine, Ser: 0.67 mg/dL (ref 0.61–1.24)
GFR, Estimated: >60 mL/min (ref >60)
Glucose, Bld: 100 mg/dL — ABNORMAL HIGH (ref 70–99)
Potassium: 4.2 mmol/L (ref 3.5–5.1)
Sodium: 133 mmol/L — ABNORMAL LOW (ref 135–145)
Total Bilirubin: 0.5 mg/dL (ref 0.3–1.2)
Total Protein: 7.6 g/dL (ref 6.5–8.1)

## 2021-07-02 LAB — CBC WITH DIFFERENTIAL/PLATELET
Abs Immature Granulocytes: 0.04 10*3/uL (ref 0.00–0.07)
Basophils Absolute: 0 10*3/uL (ref 0.0–0.1)
Basophils Relative: 1 %
Eosinophils Absolute: 0 10*3/uL (ref 0.0–0.5)
Eosinophils Relative: 0 %
HCT: 41.8 % (ref 39.0–52.0)
Hemoglobin: 13 g/dL (ref 13.0–17.0)
Immature Granulocytes: 1 %
Lymphocytes Relative: 29 %
Lymphs Abs: 2 10*3/uL (ref 0.7–4.0)
MCH: 24.9 pg — ABNORMAL LOW (ref 26.0–34.0)
MCHC: 31.1 g/dL (ref 30.0–36.0)
MCV: 79.9 fL — ABNORMAL LOW (ref 80.0–100.0)
Monocytes Absolute: 0.5 10*3/uL (ref 0.1–1.0)
Monocytes Relative: 7 %
Neutro Abs: 4.4 10*3/uL (ref 1.7–7.7)
Neutrophils Relative %: 62 %
Platelets: 227 10*3/uL (ref 150–400)
RBC: 5.23 MIL/uL (ref 4.22–5.81)
RDW: 12.8 % (ref 11.5–15.5)
WBC: 7.1 10*3/uL (ref 4.0–10.5)
nRBC: 0 % (ref 0.0–0.2)

## 2021-07-02 LAB — RESP PANEL BY RT-PCR (FLU A&B, COVID) ARPGX2
Influenza A by PCR: NEGATIVE
Influenza B by PCR: NEGATIVE
SARS Coronavirus 2 by RT PCR: NEGATIVE

## 2021-07-02 LAB — GROUP A STREP BY PCR: Group A Strep by PCR: NOT DETECTED

## 2021-07-02 MED ORDER — AMOXICILLIN-POT CLAVULANATE 875-125 MG PO TABS: 1.0000 | ORAL_TABLET | Freq: Two times a day (BID) | ORAL | 0 refills | Status: DC

## 2021-07-02 MED ORDER — MORPHINE SULFATE (PF) 4 MG/ML IV SOLN
4.0000 mg | Freq: Once | INTRAVENOUS | Status: AC
  Administered 2021-07-02: 4 mg via INTRAVENOUS
  Filled 2021-07-02: qty 1

## 2021-07-02 MED ORDER — AMOXICILLIN-POT CLAVULANATE 875-125 MG PO TABS
1.0000 | ORAL_TABLET | Freq: Once | ORAL | Status: AC
  Administered 2021-07-02: 1 via ORAL
  Filled 2021-07-02: qty 1

## 2021-07-02 MED ORDER — OXYCODONE-ACETAMINOPHEN 5-325 MG PO TABS: 1.0000 | ORAL_TABLET | Freq: Four times a day (QID) | ORAL | 0 refills | Status: DC | PRN

## 2021-07-02 MED ORDER — IOHEXOL 300 MG/ML  SOLN
75.0000 mL | Freq: Once | INTRAMUSCULAR | Status: AC | PRN
  Administered 2021-07-02: 75 mL via INTRAVENOUS

## 2021-07-02 MED ORDER — SODIUM CHLORIDE 0.9 % IV BOLUS
1000.0000 mL | Freq: Once | INTRAVENOUS | Status: AC
  Administered 2021-07-02: 1000 mL via INTRAVENOUS

## 2021-07-02 NOTE — Discharge Instructions (Signed)
Follow-up with a dentist. ? ?You likely have a dental infection ? ?Take Augmentin twice daily ? ?Take Tylenol or Motrin for pain ? ?Take Percocet for severe pain ? ?See dentist for follow-up ? ?Return to ER if you have worse dental pain, facial pain, fever, trouble swallowing ?

## 2021-07-02 NOTE — ED Triage Notes (Signed)
Patient coming from home, complaint of sore throat and headache for approx 1 week. VSS. Pt endorses 10/10 pain. ?

## 2021-07-02 NOTE — ED Provider Notes (Signed)
?[facility] EMERGENCY DEPARTMENT ?Provider Note ? ? ?CSN: [id number] ?Arrival date & time: 07/02/21  1817 ? ?  ? ?History ? ?Chief Complaint  ?Patient presents with  ? Sore Throat  ? Headache  ? ? ?John Luna is a 76 y.o. male here presenting with headache and sore throat.  Patient states that for the last week or so he noticed some sinus congestion and also facial pain.  He also states that his teeth turned more black.  He also has trouble swallowing as well.  Patient also has some headache and fell and hit his head.  Per his grandson, he does have a history of hypertension.  Patient does drink beer at baseline ? ?The history is provided by the patient. A language interpreter was used.  ? ?  ? ?Home Medications ?Prior to Admission medications   ?Medication Sig Start Date End Date Taking? Authorizing Provider  ?acetaminophen (TYLENOL) 325 MG tablet Take 650 mg by mouth every 6 (six) hours as needed.    [provider]  ?aspirin EC 81 MG tablet Take 81 mg by mouth every 6 (six) hours as needed for moderate pain.    [provider]  ?augmented betamethasone dipropionate (DIPROLENE-AF) 0.05 % cream APPLY TOPICALLY TO THE AFFECTED AREA TWICE DAILY 10/05/20   Roylene Reason, MD  ?losartan (COZAAR) 50 MG tablet Take 1 tablet (50 mg total) by mouth daily. 01/25/21 07/24/21  Grayce Sessions, NP  ?Melatonin 1 MG CHEW Chew 1 tablet by mouth daily. 07/07/20   Seawell, Jaimie A, DO  ?Multiple Vitamin (MULTIVITAMIN WITH MINERALS) TABS Take 1 tablet by mouth daily.    [provider]  ?   ? ?Allergies    ?Patient has no known allergies.   ? ?Review of Systems   ?Review of Systems  ?HENT:  Positive for sore throat.   ?Neurological:  Positive for headaches.  ?All other systems reviewed and are negative. ? ?Physical Exam ?Updated Vital Signs ?BP (!) 150/82 (BP Location: Right Arm)   Pulse 73   Temp 97.7 ?F (36.5 ?C) (Oral)   Resp 20   SpO2 100%  ?Physical Exam ?Vitals and nursing  note reviewed.  ?Constitutional:   ?   Comments: Uncomfortable, chronically ill  ?HENT:  ?   Head:  ?   Comments: Posterior scalp hematoma ?   Mouth/Throat:  ?   Comments: Mucous membrane is dry.  Patient is missing multiple teeth.  No obvious periapical abscess ?Eyes:  ?   Conjunctiva/sclera: Conjunctivae normal.  ?Cardiovascular:  ?   Rate and Rhythm: Normal rate and regular rhythm.  ?   Heart sounds: Normal heart sounds.  ?Pulmonary:  ?   Effort: Pulmonary effort is normal.  ?   Breath sounds: Normal breath sounds.  ?Abdominal:  ?   General: Bowel sounds are normal.  ?   Palpations: Abdomen is soft.  ?Musculoskeletal:  ?   Cervical back: Normal range of motion and neck supple.  ?Skin: ?   General: Skin is warm.  ?   Capillary Refill: Capillary refill takes less than 2 seconds.  ?Neurological:  ?   General: No focal deficit present.  ?   Mental Status: He is oriented to person, place, and time.  ?Psychiatric:     ?   Mood and Affect: Mood normal.     ?   Behavior: Behavior normal.  ? ? ?ED Results / Procedures / Treatments   ?Labs ?(all labs ordered are listed, but  only abnormal results are displayed) ?Labs Reviewed  ?GROUP A STREP BY PCR  ?RESP PANEL BY RT-PCR (FLU A&B, COVID) [id number]  ?CBC WITH DIFFERENTIAL/PLATELET  ?COMPREHENSIVE METABOLIC PANEL  ?I-STAT CHEM 8, ED  ? ? ?EKG ?None ? ?Radiology ?No results found. ? ?Procedures ?Procedures  ? ? ?Medications Ordered in ED ?Medications  ?sodium chloride 0.9 % bolus 1,000 mL (1,000 mLs Intravenous New Bag/Given 07/02/21 1918)  ?morphine (PF) 4 MG/ML injection 4 mg (4 mg Intravenous Given 07/02/21 1920)  ? ? ?ED Course/ Medical Decision Making/ A&P ?  ?                        ?Medical Decision Making ?John Luna is a 76 y.o. male here with sore throat and headache.  Patient also has some sinus tenderness.  Differential is broad.  Consider dental infection versus sinusitis versus viral infection versus neck mass versus strep.  Plan to get CBC and CMP and CT head and  CT maxillofacial and CT neck.  Will give pain medicine and IV fluids ? ? ?9:50 PM ?WBC is normal.  CT head and CT soft tissue neck did not show any mass or inflammatory changes or deep space infection.  I wonder if he has mild dental infection causing his pain.  Will discharge home with Augmentin and pain medicine.  ? ?Problems Addressed: ?Dental infection: acute illness or injury ? ?Amount and/or Complexity of Data Reviewed ?External Data Reviewed: notes. ?Labs: ordered. Decision-making details documented in ED Course. ?Radiology: ordered and independent interpretation performed. Decision-making details documented in ED Course. ? ?Risk ?Prescription drug management. ? ?Final Clinical Impression(s) / ED Diagnoses ?Final diagnoses:  ?None  ? ? ?Rx / DC Orders ?ED Discharge Orders   ? ? None  ? ?  ? ? ?  ?Charlynne Pander, MD ?07/02/21 2151 ? ?

## 2021-07-02 NOTE — ED Notes (Signed)
Discharge instructions reviewed with patient and family. Patient and family verbalized understanding of instructions. Follow-up care and medications were reviewed. Patient ambulatory with steady gait. VSS upon discharge.  °

## 2021-07-27 ENCOUNTER — Other Ambulatory Visit (INDEPENDENT_AMBULATORY_CARE_PROVIDER_SITE_OTHER): Payer: Self-pay | Admitting: Primary Care

## 2021-07-27 DIAGNOSIS — I10 Essential (primary) hypertension: Secondary | ICD-10-CM

## 2021-09-09 DIAGNOSIS — H52223 Regular astigmatism, bilateral: Secondary | ICD-10-CM | POA: Diagnosis not present

## 2022-02-19 ENCOUNTER — Ambulatory Visit: Payer: Medicare HMO

## 2022-02-19 DIAGNOSIS — Z23 Encounter for immunization: Secondary | ICD-10-CM

## 2022-03-24 ENCOUNTER — Ambulatory Visit (INDEPENDENT_AMBULATORY_CARE_PROVIDER_SITE_OTHER): Payer: Medicare Other

## 2022-03-24 ENCOUNTER — Encounter (HOSPITAL_COMMUNITY): Payer: Self-pay | Admitting: *Deleted

## 2022-03-24 ENCOUNTER — Ambulatory Visit (HOSPITAL_COMMUNITY)
Admission: EM | Admit: 2022-03-24 | Discharge: 2022-03-24 | Disposition: A | Discharge status: Home / Self Care | Payer: Medicare Other | Attending: Family Medicine | Admitting: Family Medicine

## 2022-03-24 DIAGNOSIS — I509 Heart failure, unspecified: Secondary | ICD-10-CM | POA: Diagnosis not present

## 2022-03-24 DIAGNOSIS — R051 Acute cough: Secondary | ICD-10-CM

## 2022-03-24 DIAGNOSIS — J189 Pneumonia, unspecified organism: Secondary | ICD-10-CM

## 2022-03-24 MED ORDER — DOXYCYCLINE HYCLATE 100 MG PO CAPS: 100.0000 mg | ORAL_CAPSULE | Freq: Two times a day (BID) | ORAL | 0 refills | Status: DC

## 2022-03-24 NOTE — ED Triage Notes (Signed)
Pts grandson is interpreting for pt today he has cough, congestion, and sore throat x 5-7 days. He has stomachache, headache. He has taken nyquil without relief.

## 2022-03-24 NOTE — Discharge Instructions (Signed)
He was seen today for cough and congestion.  His xray showed areas of increased fluid, but I am also concerned for a pneumonia. I am going to treat you with an antibiotic today for possible pneumonia.  I recommend you make an appointment with a primary care provider for care of your chronic needs, as well as follow up from this visit to make sure the fluid has resolved, or not getting any worse.  If he feels worse despite the use of the antibiotic, develops worsening cough or shortness of breath, then please go to the ER for further evaluation.

## 2022-03-24 NOTE — ED Provider Notes (Signed)
[facility]    CSN: [id number] Arrival date & time: 03/24/22  1103      History   Chief Complaint Chief Complaint  Patient presents with   Cough   Nasal Congestion   Sore Throat    HPI John Luna is a 76 y.o. male.   Patient is here for cough, headaches, chest congestion, mucous x 1 week.  Stomach pain, decreased appetite.  No fevers.  Taking nyquil for his symptoms.  No known sick contacts.   + wheezing, mild sob.  He sounds very congested in his chest and hurts to cough.  No h/o asthma or copd.  Does not smoke.   He does have h/o chf.  Has not seen a dr over a year.  Not on any diuretics.  No swelling in his LE.        Past Medical History:  Diagnosis Date   Acute pulmonary edema (HCC)    Altered mental status    Hyperkalemia 07/09/2019   Irritant contact dermatitis 06/30/2020    Patient Active Problem List   Diagnosis Date Noted   Dysphagia 07/07/2020   Hypertension 06/30/2020   Prediabetes 06/30/2020   Fatigue 10/07/2019   Constipation 06/28/2019   Preventative health care 06/28/2019   Chronic diastolic CHF (congestive heart failure) (HCC) 06/20/2019   Protein-calorie malnutrition, severe 06/20/2019   COPD (chronic obstructive pulmonary disease) (HCC) 09/03/2011    History reviewed. No pertinent surgical history.     Home Medications    Prior to Admission medications   Medication Sig Start Date End Date Taking? Authorizing Provider  aspirin EC 81 MG tablet Take 81 mg by mouth every 6 (six) hours as needed for moderate pain.   Yes [provider]  augmented betamethasone dipropionate (DIPROLENE-AF) 0.05 % cream APPLY TOPICALLY TO THE AFFECTED AREA TWICE DAILY 10/05/20  Yes Roylene Reason, MD  losartan (COZAAR) 50 MG tablet TAKE 1 TABLET(50 MG) BY MOUTH DAILY 07/27/21  Yes Grayce Sessions, NP  Melatonin 1 MG CHEW Chew 1 tablet by mouth daily. 07/07/20  Yes Seawell, Jaimie A, DO  Multiple Vitamin (MULTIVITAMIN WITH MINERALS)  TABS Take 1 tablet by mouth daily.   Yes [provider]  oxyCODONE-acetaminophen (PERCOCET) 5-325 MG tablet Take 1 tablet by mouth every 6 (six) hours as needed. 07/02/21  Yes Charlynne Pander, MD  acetaminophen (TYLENOL) 325 MG tablet Take 650 mg by mouth every 6 (six) hours as needed.    [provider]  amoxicillin-clavulanate (AUGMENTIN) 875-125 MG tablet Take 1 tablet by mouth every 12 (twelve) hours. 07/02/21   Charlynne Pander, MD    Family History History reviewed. No pertinent family history.  Social History Social History   Tobacco Use   Smoking status: Former    Packs/day: 1.00    Years: 50.00    Total pack years: 50.00    Types: Cigarettes    Quit date: 09/13/2011    Years since quitting: 10.5   Smokeless tobacco: Never  Substance Use Topics   Alcohol use: Yes   Drug use: No     Allergies   Patient has no known allergies.   Review of Systems Review of Systems  Constitutional:  Negative for chills and fever.  HENT: Negative.    Respiratory:  Positive for cough and shortness of breath.   Gastrointestinal:  Positive for abdominal pain and nausea.  Genitourinary: Negative.   Musculoskeletal: Negative.   Skin: Negative.   Psychiatric/Behavioral: Negative.  Physical Exam Triage Vital Signs ED Triage Vitals  Enc Vitals Group     BP 03/24/22 1246 (!) 146/78     Pulse Rate 03/24/22 1246 85     Resp 03/24/22 1246 20     Temp 03/24/22 1246 97.8 F (36.6 C)     Temp Source 03/24/22 1246 Oral     SpO2 03/24/22 1246 94 %     Weight --      Height --      Head Circumference --      Peak Flow --      Pain Score 03/24/22 1243 8     Pain Loc --      Pain Edu? --      Excl. in GC? --    No data found.  Updated Vital Signs BP (!) 146/78 (BP Location: Right Arm)   Pulse 85   Temp 97.8 F (36.6 C) (Oral)   Resp 20   SpO2 94%   Visual Acuity Right Eye Distance:   Left Eye Distance:   Bilateral Distance:    Right Eye Near:    Left Eye Near:    Bilateral Near:     Physical Exam Constitutional:      Appearance: Normal appearance.  HENT:     Nose: Nose normal.  Cardiovascular:     Rate and Rhythm: Normal rate and regular rhythm.  Pulmonary:     Effort: Pulmonary effort is normal.     Breath sounds: Rhonchi present. No wheezing.  Musculoskeletal:        General: Normal range of motion.     Cervical back: Normal range of motion and neck supple.  Skin:    General: Skin is warm.  Neurological:     General: No focal deficit present.     Mental Status: He is alert.  Psychiatric:        Mood and Affect: Mood normal.      UC Treatments / Results  Labs (all labs ordered are listed, but only abnormal results are displayed) Labs Reviewed - No data to display  EKG   Radiology DG Chest 2 View  Result Date: 03/24/2022 CLINICAL DATA:  Cough. EXAM: CHEST - 2 VIEW COMPARISON:  December 20, 2020. FINDINGS: The heart size and mediastinal contours are within normal limits. 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 Stable left basilar atelectasis or scarring is noted. Stable right upper lobe opacity is noted consistent with scarring. Small right pleural effusion is noted. The visualized skeletal structures are unremarkable. IMPRESSION: Stable chronic findings are noted in both lungs, with small right pleural effusion. Electronically Signed   By: Lupita Raider M.D.   On: 03/24/2022 13:30    Procedures Procedures (including critical care time)  Medications Ordered in UC Medications - No data to display  Initial Impression / Assessment and Plan / UC Course  I have reviewed the triage vital signs and the nursing notes.  Pertinent labs & imaging results that were available during my care of the patient were reviewed by me and considered in my medical decision making (see chart for details).   Final Clinical Impressions(s) / UC Diagnoses   Final diagnoses:   Acute cough  Pneumonia due to infectious organism, unspecified laterality, unspecified part of lung  Congestive heart failure, unspecified HF chronicity, unspecified heart failure type [facility])     Discharge Instructions      He was seen today for cough and congestion.  His xray showed areas of increased fluid, but I am also concerned for a pneumonia. I am going to treat you with an antibiotic today for possible pneumonia.  I recommend you make an appointment with a primary care provider for care of your chronic needs, as well as follow up from this visit to make sure the fluid has resolved, or not getting any worse.  If he feels worse despite the use of the antibiotic, develops worsening cough or shortness of breath, then please go to the ER for further evaluation.     ED Prescriptions     Medication Sig Dispense Auth. Provider   doxycycline (VIBRAMYCIN) 100 MG capsule Take 1 capsule (100 mg total) by mouth 2 (two) times daily. 20 capsule Jannifer Franklin, MD      PDMP not reviewed this encounter.   Jannifer Franklin, MD 03/24/22 1348

## 2022-09-08 IMAGING — CT CT HEAD W/O CM
3 series · 16 of 47 positions shown, 19 images · non-contrast
Comparison: 12/20/2020

CLINICAL DATA: Worsening headaches.



[Series 3: head 5.0 h30s · axial · 0.40mm/px · z∈[-96,+34]mm · 10 of 32 slices shown, 13 images]
[im 3/32  brain]
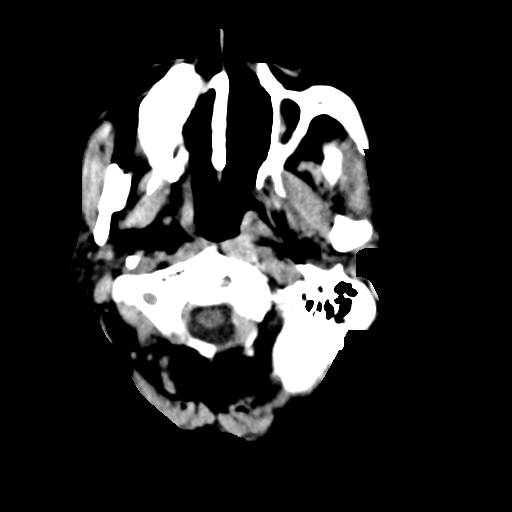
[im 3/32  bone]
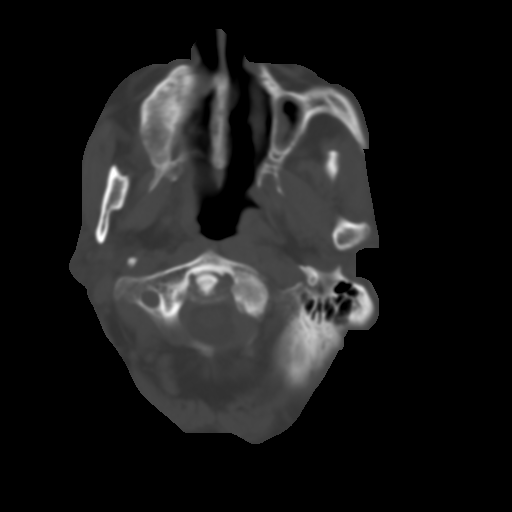
[im 6/32  brain]
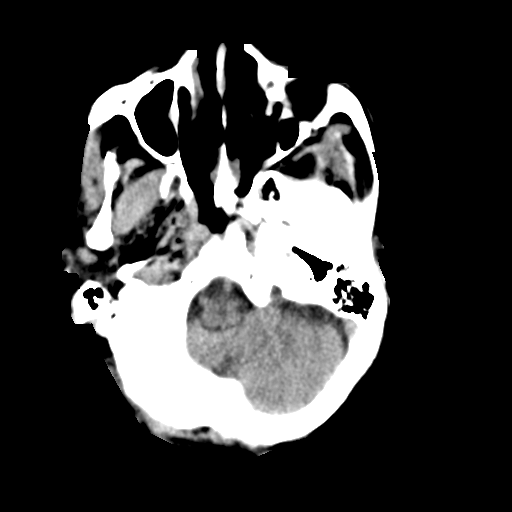
[im 9/32  brain]
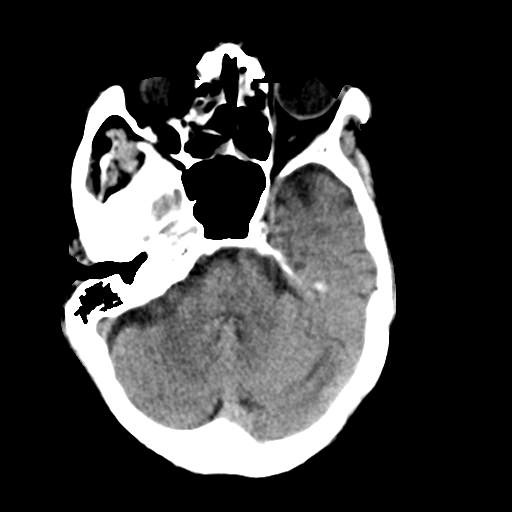
[im 11/32  brain]
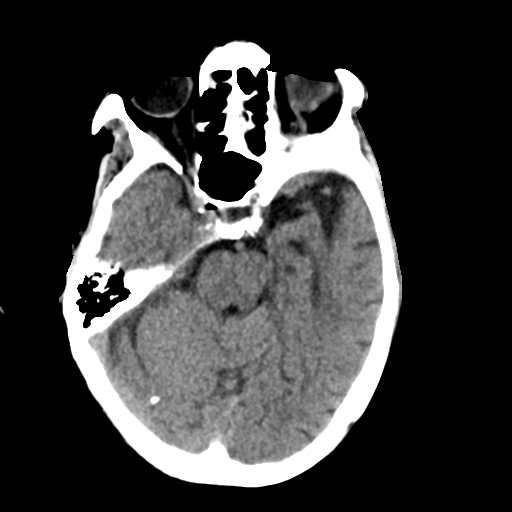
[im 14/32  brain]
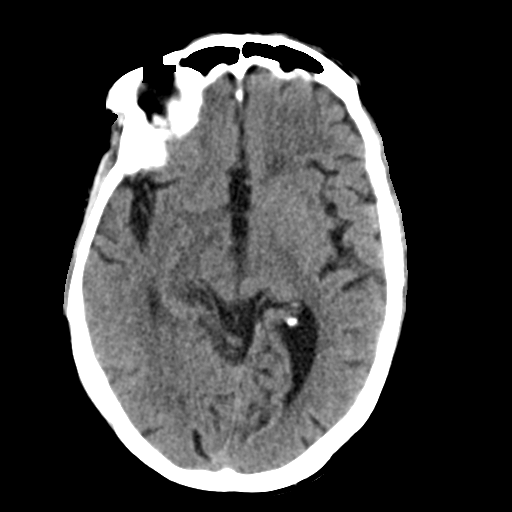
[im 14/32  bone]
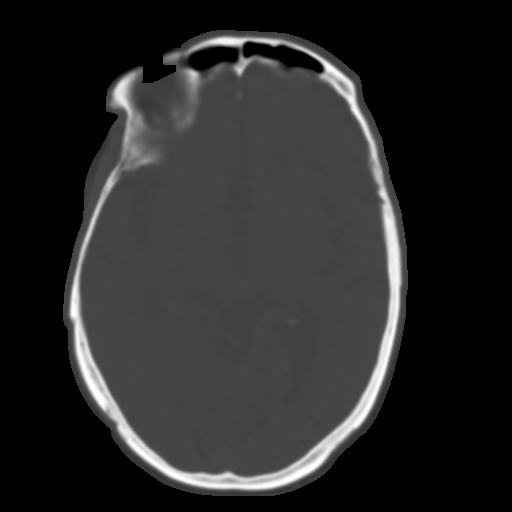
[im 18/32  brain]
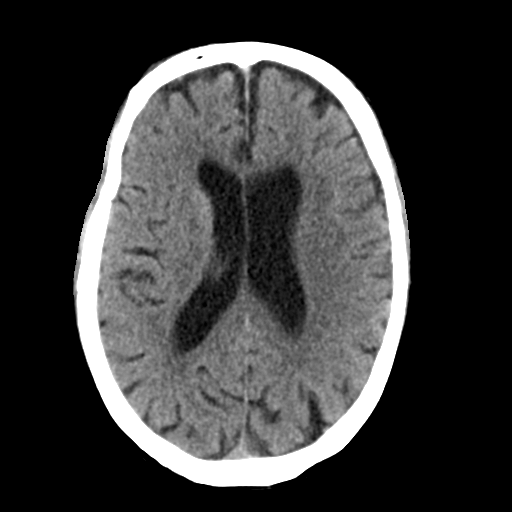
[im 21/32  brain]
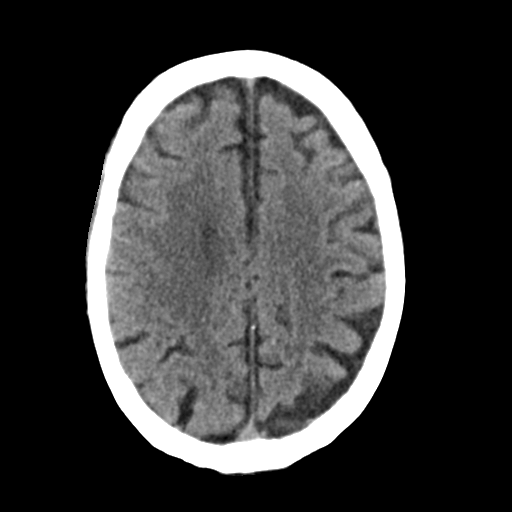
[im 24/32  brain]
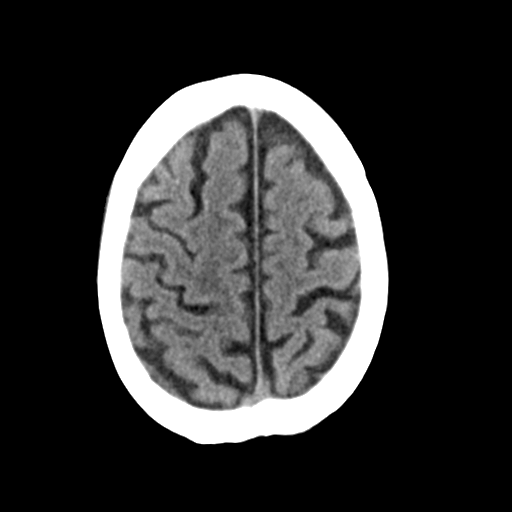
[im 26/32  brain]
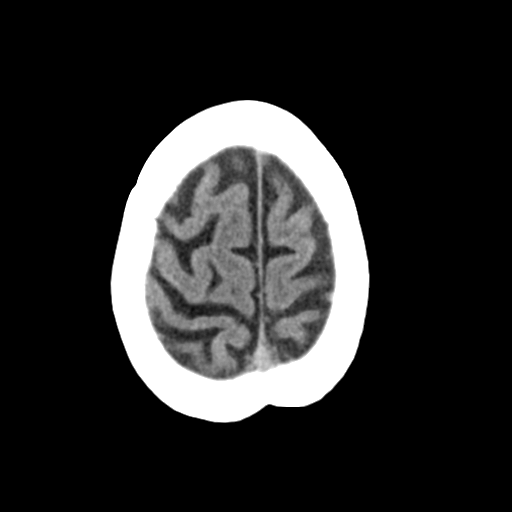
[im 26/32  bone]
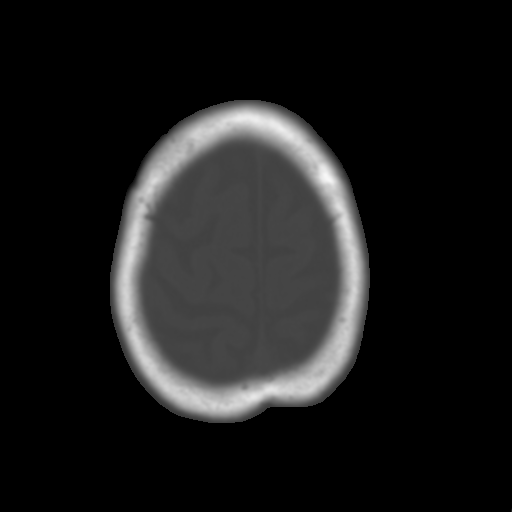
[im 29/32  brain]
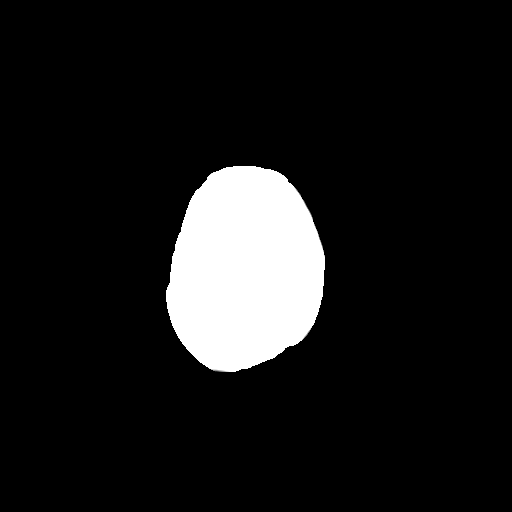

[Series 9: head 3.0 mpr cor · coronal · 0.30mm/px · 3 of 67 slices shown]
[im 23/67  brain]
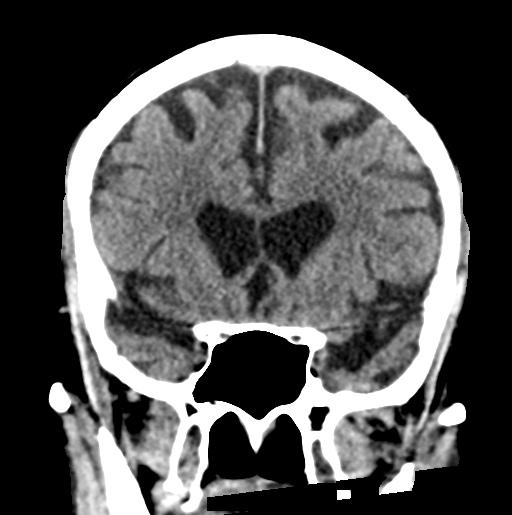
[im 30/67  brain]
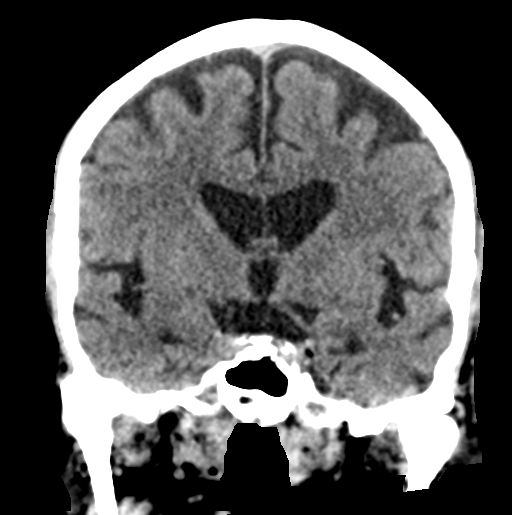
[im 37/67  brain]
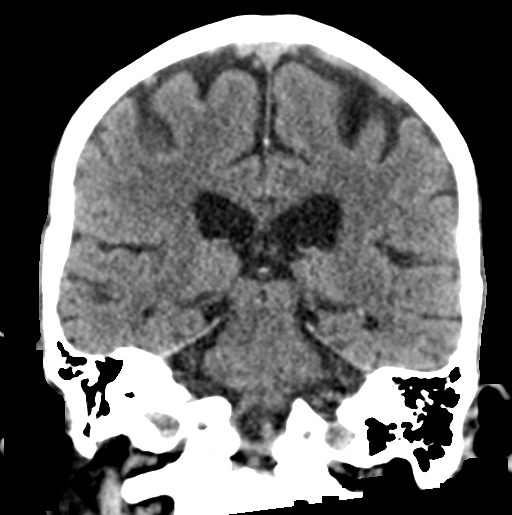

[Series 10: head 3.0 mpr sag · sagittal · 0.30mm/px · 3 of 54 slices shown]
[im 18/54  brain]
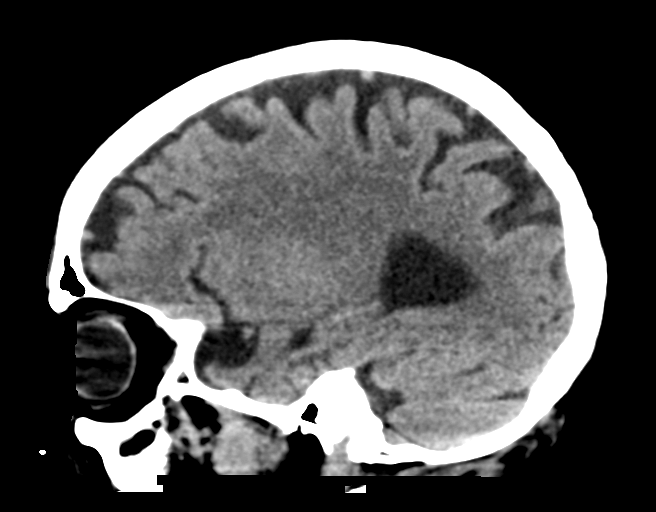
[im 27/54  brain]
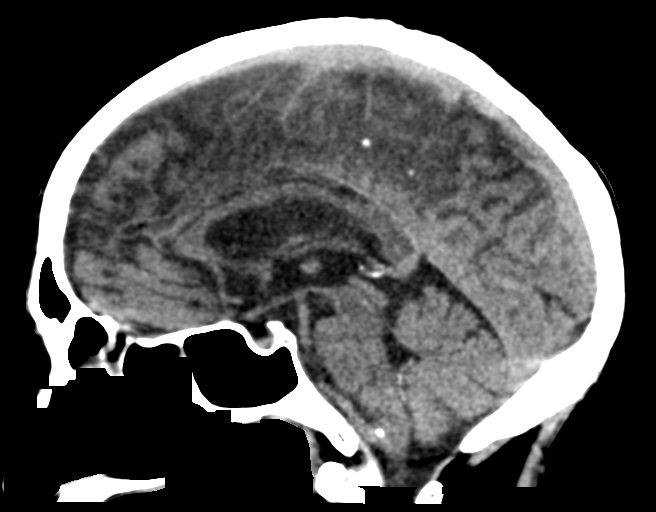
[im 36/54  brain]
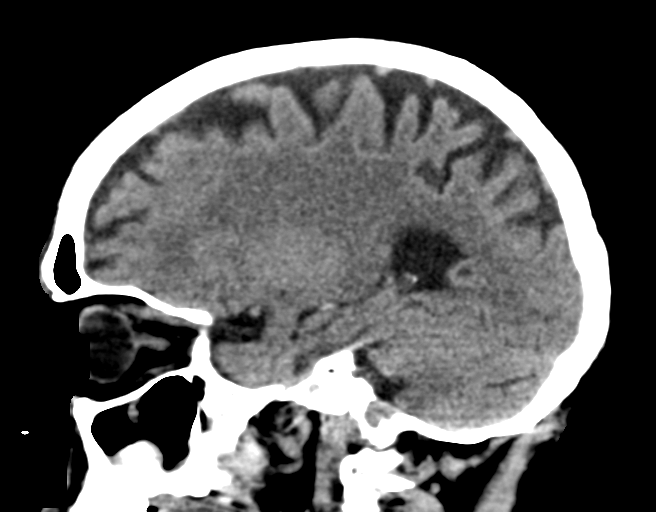

[16 of 47 positions shown; findings below may reference images not displayed]

FINDINGS: Brain: No evidence of intracranial hemorrhage, acute infarction,
hydrocephalus, extra-axial collection, or mass lesion/mass effect.
Mild diffuse cerebral atrophy is again demonstrated.

Vascular:  No hyperdense vessel or other acute findings.

Skull: No evidence of fracture or other significant bone
abnormality.

Sinuses/Orbits: No acute findings. Chronic mucosal thickening is
again seen involving the ethmoid and maxillary sinuses bilaterally.

Other: None.
IMPRESSION: No acute intracranial abnormality.

Stable mild cerebral atrophy.

Chronic sinusitis.

## 2022-09-08 IMAGING — CT CT NECK W/ CM
3 of 4 series · 11 of 33 positions shown, 13 images · IV contrast (APPLIED)
Comparison: None.

CLINICAL DATA: Initial evaluation for acute epiglottitis or
tonsillitis suspected.

EXAM:
CT NECK WITH CONTRAST
TECHNIQUE: Multidetector CT imaging of the neck was performed using the
standard protocol following the bolus administration of intravenous
contrast.

[Series 11: neck 2.0 mpr ax · axial · 0.30mm/px · z∈[-321,-162]mm · 3 of 134 slices shown, 4 images]
[im 23/134  soft-tissue]
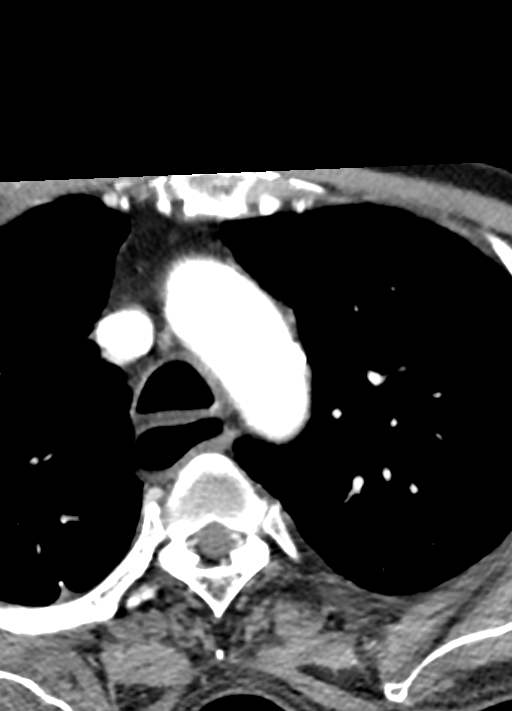
[im 23/134  bone]
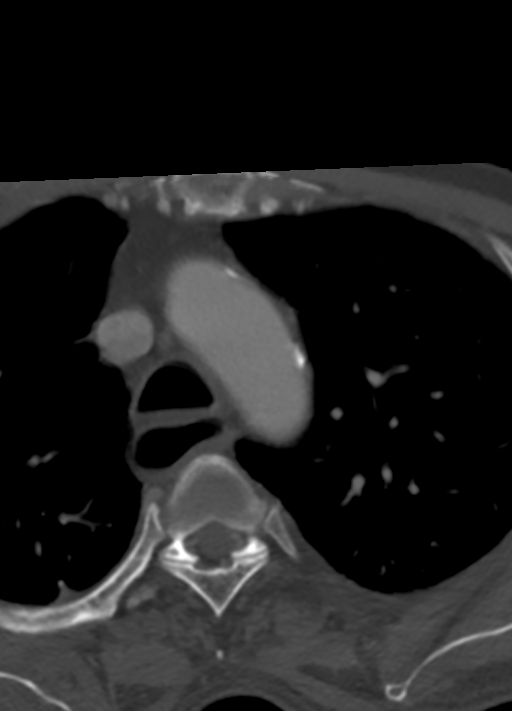
[im 67/134  bone]
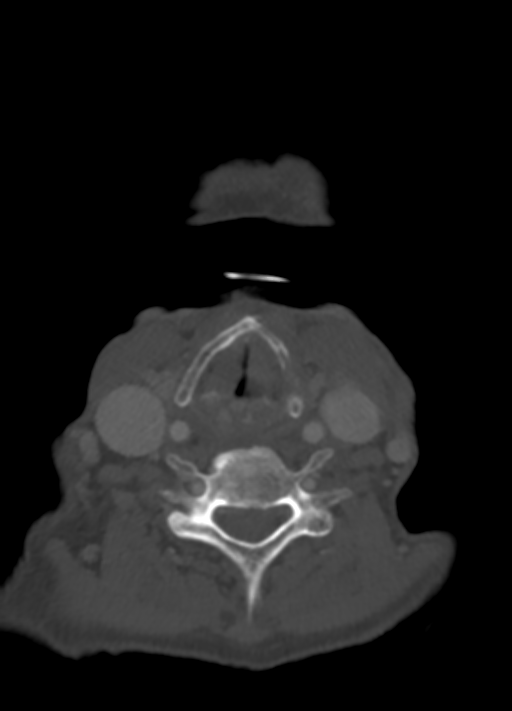
[im 111/134  bone]
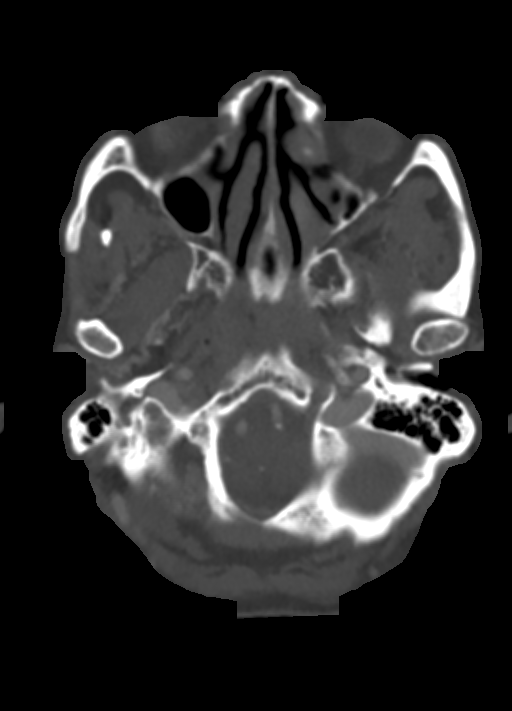

[Series 15: coronal st · coronal · 0.34mm/px · 3 of 107 slices shown]
[im 39/107  bone]
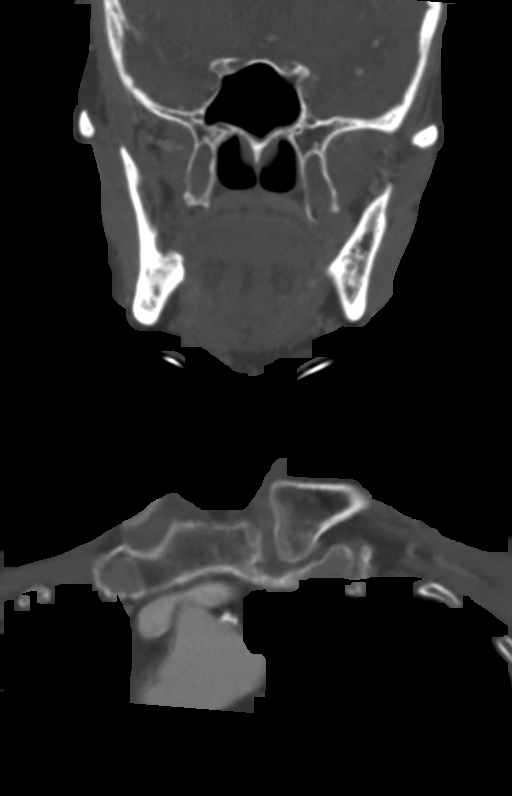
[im 49/107  bone]
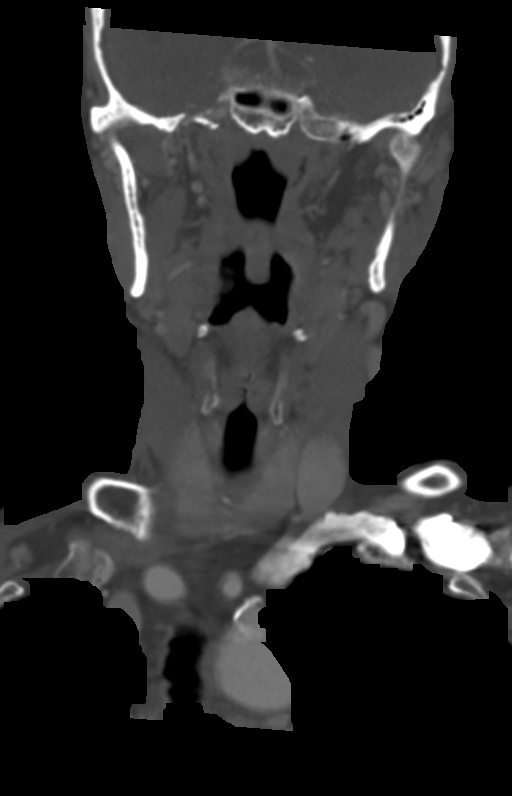
[im 59/107  bone]
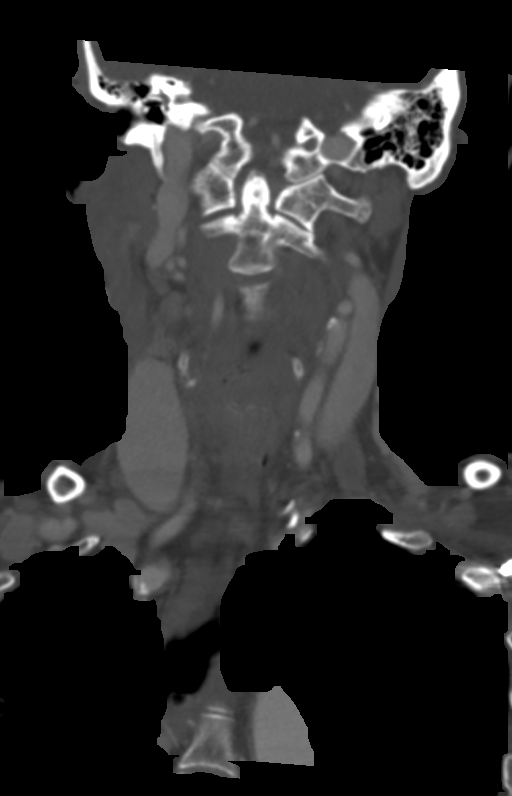

[Series 16: sagittal st · sagittal · 0.40mm/px · 5 of 88 slices shown, 6 images]
[im 30/88  bone]
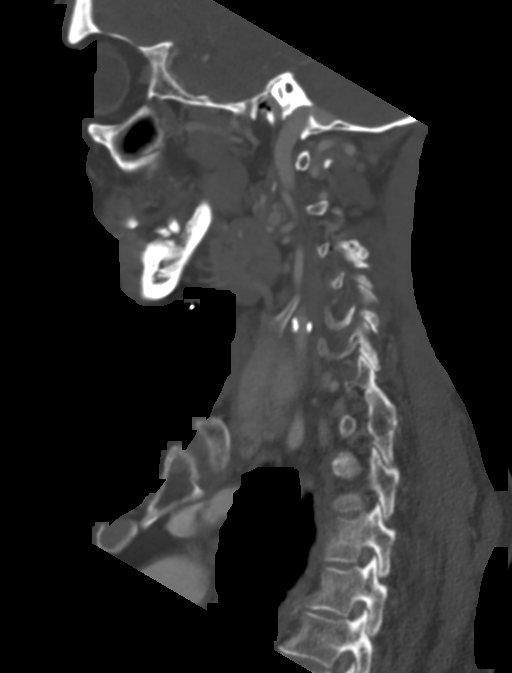
[im 37/88  bone]
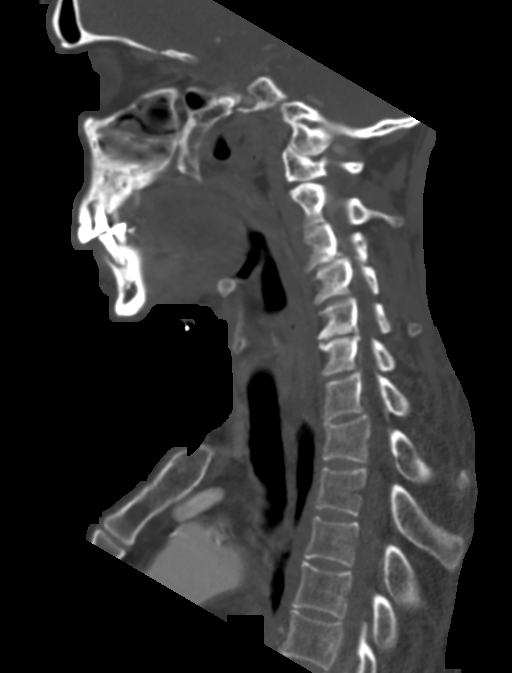
[im 44/88  soft-tissue]
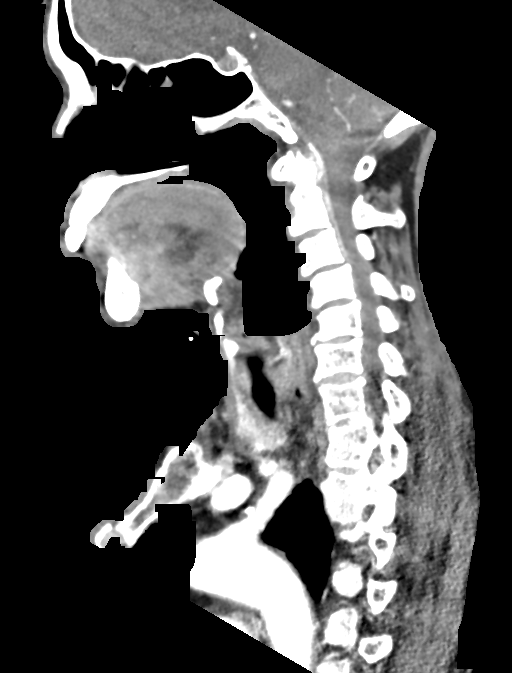
[im 44/88  bone]
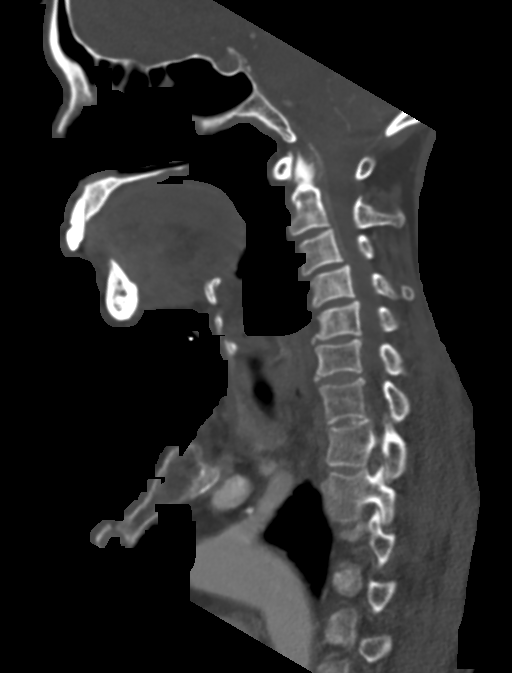
[im 51/88  bone]
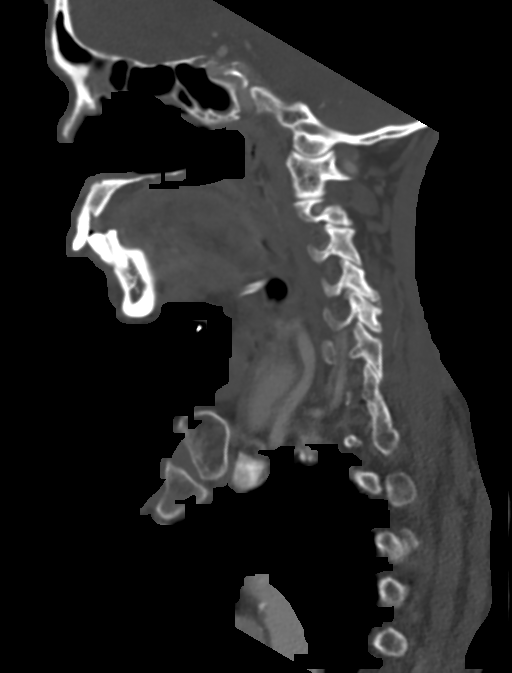
[im 59/88  bone]
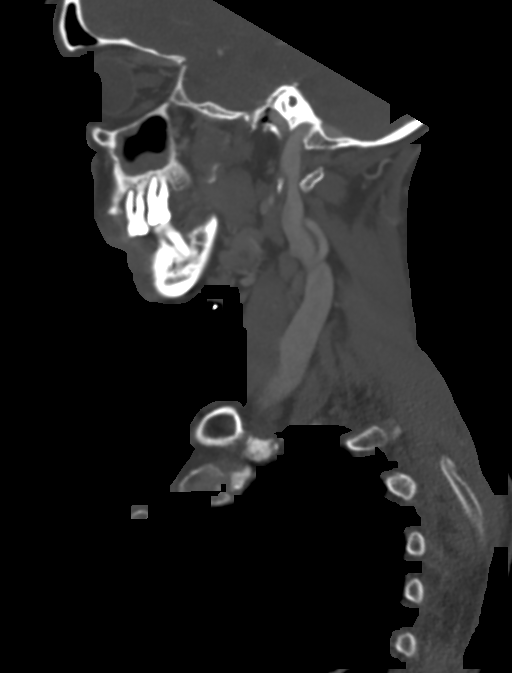

[11 of 33 positions shown; findings below may reference images not displayed]

RADIATION DOSE REDUCTION: This exam was performed according to the
departmental dose-optimization program which includes automated
exposure control, adjustment of the mA and/or kV according to
patient size and/or use of iterative reconstruction technique.

CONTRAST:  75mL OMNIPAQUE IOHEXOL 300 MG/ML  SOLN
FINDINGS: Pharynx and larynx: Oral cavity within normal limits. No acute
inflammatory changes seen about the teeth. Oropharynx and
nasopharynx within normal limits. No retropharyngeal collection or
swelling. Epiglottis normal. Vallecula clear. Hypopharynx and
supraglottic larynx within normal limits. Evaluation of the glottis
limited by motion, but is grossly within normal limits. Subglottic
airway patent clear. Visualized upper-mid esophagus is somewhat
patulous.

Salivary glands: Salivary glands including the parotid and
submandibular glands are within normal limits.

Thyroid: Normal.

Lymph nodes: No enlarged or pathologic adenopathy within the neck.

Vascular: Normal intravascular enhancement seen throughout the neck.
Moderate atherosclerosis.

Limited intracranial: Unremarkable.

Visualized orbits: Prior bilateral ocular lens replacement.
Otherwise unremarkable.

Mastoids and visualized paranasal sinuses: Mild-to-moderate mucosal
thickening present throughout the ethmoidal air cells and maxillary
sinuses. No air-fluid levels. Mastoid air cells and middle ear
cavities are well pneumatized and free of fluid.

Skeleton: Subcentimeter lucent lesion within the C5 vertebral body
favored to reflect a small hemangioma. No destructive lytic or
blastic osseous lesions. Mild degenerative grade 1 anterolisthesis
of C3 on C4. Poor dentition noted.

Upper chest: Advanced emphysematous changes with superimposed
scattered areas of echotexture distortion noted within the
visualized lungs. Visualized upper chest demonstrates no other acute
finding.

Other: None.
IMPRESSION: 1. Negative CT of the neck. No acute inflammatory changes or other
abnormality identified.
2. Aortic Atherosclerosis ([id number]-YTQ.[id number]) and Emphysema ([id number]).

## 2023-02-20 ENCOUNTER — Emergency Department (HOSPITAL_COMMUNITY)
Admission: EM | Admit: 2023-02-20 | Discharge: 2023-02-21 | Disposition: A | Discharge status: Home / Self Care | Payer: 59 | Source: Home / Self Care

## 2023-02-20 NOTE — ED Notes (Signed)
Called pt x3, no answer.  

## 2023-02-20 NOTE — ED Notes (Signed)
Called pt, no answer.

## 2023-02-20 NOTE — ED Notes (Signed)
Called pt x2 , no answer.

## 2023-02-23 ENCOUNTER — Ambulatory Visit (INDEPENDENT_AMBULATORY_CARE_PROVIDER_SITE_OTHER): Payer: 59

## 2023-02-23 ENCOUNTER — Ambulatory Visit (HOSPITAL_COMMUNITY)
Admission: EM | Admit: 2023-02-23 | Discharge: 2023-02-23 | Disposition: A | Discharge status: Home / Self Care | Payer: 59 | Attending: Internal Medicine | Admitting: Internal Medicine

## 2023-02-23 ENCOUNTER — Other Ambulatory Visit: Payer: Self-pay

## 2023-02-23 ENCOUNTER — Encounter (HOSPITAL_COMMUNITY): Payer: Self-pay

## 2023-02-23 ENCOUNTER — Emergency Department (HOSPITAL_COMMUNITY): Payer: 59

## 2023-02-23 ENCOUNTER — Inpatient Hospital Stay (HOSPITAL_COMMUNITY)
Admission: EM | Admit: 2023-02-23 | Discharge: 2023-02-25 | DRG: 871 | Disposition: A | Discharge status: Home / Self Care | Payer: 59 | Attending: Internal Medicine | Admitting: Internal Medicine

## 2023-02-23 DIAGNOSIS — Z681 Body mass index (BMI) 19 or less, adult: Secondary | ICD-10-CM

## 2023-02-23 DIAGNOSIS — R053 Chronic cough: Secondary | ICD-10-CM | POA: Diagnosis not present

## 2023-02-23 DIAGNOSIS — J470 Bronchiectasis with acute lower respiratory infection: Secondary | ICD-10-CM | POA: Diagnosis present

## 2023-02-23 DIAGNOSIS — R6520 Severe sepsis without septic shock: Secondary | ICD-10-CM | POA: Diagnosis present

## 2023-02-23 DIAGNOSIS — A419 Sepsis, unspecified organism: Principal | ICD-10-CM | POA: Insufficient documentation

## 2023-02-23 DIAGNOSIS — E872 Acidosis, unspecified: Secondary | ICD-10-CM | POA: Diagnosis present

## 2023-02-23 DIAGNOSIS — J471 Bronchiectasis with (acute) exacerbation: Secondary | ICD-10-CM

## 2023-02-23 DIAGNOSIS — R0602 Shortness of breath: Secondary | ICD-10-CM | POA: Diagnosis not present

## 2023-02-23 DIAGNOSIS — E871 Hypo-osmolality and hyponatremia: Secondary | ICD-10-CM | POA: Diagnosis present

## 2023-02-23 DIAGNOSIS — Z79899 Other long term (current) drug therapy: Secondary | ICD-10-CM

## 2023-02-23 DIAGNOSIS — J189 Pneumonia, unspecified organism: Principal | ICD-10-CM | POA: Diagnosis present

## 2023-02-23 DIAGNOSIS — Z1152 Encounter for screening for COVID-19: Secondary | ICD-10-CM

## 2023-02-23 DIAGNOSIS — E43 Unspecified severe protein-calorie malnutrition: Secondary | ICD-10-CM | POA: Diagnosis present

## 2023-02-23 DIAGNOSIS — I10 Essential (primary) hypertension: Secondary | ICD-10-CM | POA: Diagnosis present

## 2023-02-23 DIAGNOSIS — R059 Cough, unspecified: Secondary | ICD-10-CM | POA: Diagnosis present

## 2023-02-23 DIAGNOSIS — Z87891 Personal history of nicotine dependence: Secondary | ICD-10-CM

## 2023-02-23 LAB — CREATININE, SERUM
Creatinine, Ser: 0.7 mg/dL (ref 0.61–1.24)
GFR, Estimated: >60 mL/min (ref >60)

## 2023-02-23 LAB — COMPREHENSIVE METABOLIC PANEL
ALT: 12 U/L (ref 0–44)
AST: 23 U/L (ref 15–41)
Albumin: 3 g/dL — ABNORMAL LOW (ref 3.5–5.0)
Alkaline Phosphatase: 78 U/L (ref 38–126)
Anion gap: 11 (ref 5–15)
BUN: 7 mg/dL — ABNORMAL LOW (ref 8–23)
CO2: 20 mmol/L — ABNORMAL LOW (ref 22–32)
Calcium: 8.8 mg/dL — ABNORMAL LOW (ref 8.9–10.3)
Chloride: 98 mmol/L (ref 98–111)
Creatinine, Ser: 0.78 mg/dL (ref 0.61–1.24)
GFR, Estimated: >60 mL/min (ref >60)
Glucose, Bld: 86 mg/dL (ref 70–99)
Potassium: 4.3 mmol/L (ref 3.5–5.1)
Sodium: 129 mmol/L — ABNORMAL LOW (ref 135–145)
Total Bilirubin: 1.6 mg/dL — ABNORMAL HIGH (ref 0.3–1.2)
Total Protein: 7 g/dL (ref 6.5–8.1)

## 2023-02-23 LAB — HIV ANTIBODY (ROUTINE TESTING W REFLEX): HIV Screen 4th Generation wRfx: NONREACTIVE

## 2023-02-23 LAB — CBC WITH DIFFERENTIAL/PLATELET
Abs Immature Granulocytes: 0.09 10*3/uL — ABNORMAL HIGH (ref 0.00–0.07)
Basophils Absolute: 0.1 10*3/uL (ref 0.0–0.1)
Basophils Relative: 1 %
Eosinophils Absolute: 0.1 10*3/uL (ref 0.0–0.5)
Eosinophils Relative: 1 %
HCT: 41.1 % (ref 39.0–52.0)
Hemoglobin: 13.2 g/dL (ref 13.0–17.0)
Immature Granulocytes: 1 %
Lymphocytes Relative: 9 %
Lymphs Abs: 0.7 10*3/uL (ref 0.7–4.0)
MCH: 25.5 pg — ABNORMAL LOW (ref 26.0–34.0)
MCHC: 32.1 g/dL (ref 30.0–36.0)
MCV: 79.3 fL — ABNORMAL LOW (ref 80.0–100.0)
Monocytes Absolute: 0.7 10*3/uL (ref 0.1–1.0)
Monocytes Relative: 8 %
Neutro Abs: 6.8 10*3/uL (ref 1.7–7.7)
Neutrophils Relative %: 80 %
Platelets: 160 10*3/uL (ref 150–400)
RBC: 5.18 MIL/uL (ref 4.22–5.81)
RDW: 13.4 % (ref 11.5–15.5)
WBC: 8.4 10*3/uL (ref 4.0–10.5)
nRBC: 0 % (ref 0.0–0.2)

## 2023-02-23 LAB — TROPONIN I (HIGH SENSITIVITY)
Troponin I (High Sensitivity): 25 ng/L — ABNORMAL HIGH (ref <18)
Troponin I (High Sensitivity): 18 ng/L — ABNORMAL HIGH (ref <18)

## 2023-02-23 LAB — CBC
HCT: 32.3 % — ABNORMAL LOW (ref 39.0–52.0)
Hemoglobin: 10.8 g/dL — ABNORMAL LOW (ref 13.0–17.0)
MCH: 25.4 pg — ABNORMAL LOW (ref 26.0–34.0)
MCHC: 33.4 g/dL (ref 30.0–36.0)
MCV: 76 fL — ABNORMAL LOW (ref 80.0–100.0)
Platelets: 164 10*3/uL (ref 150–400)
RBC: 4.25 MIL/uL (ref 4.22–5.81)
RDW: 12.8 % (ref 11.5–15.5)
WBC: 7.3 10*3/uL (ref 4.0–10.5)
nRBC: 0 % (ref 0.0–0.2)

## 2023-02-23 LAB — OSMOLALITY: Osmolality: 272 mosm/kg — ABNORMAL LOW (ref 275–295)

## 2023-02-23 LAB — I-STAT CG4 LACTIC ACID, ED
Lactic Acid, Venous: 2.5 mmol/L (ref 0.5–1.9)
Lactic Acid, Venous: 0.9 mmol/L (ref 0.5–1.9)

## 2023-02-23 LAB — RESP PANEL BY RT-PCR (RSV, FLU A&B, COVID)  RVPGX2
Influenza A by PCR: NEGATIVE
Influenza B by PCR: NEGATIVE
Resp Syncytial Virus by PCR: NEGATIVE
SARS Coronavirus 2 by RT PCR: NEGATIVE

## 2023-02-23 LAB — PROTIME-INR
INR: 0.9 (ref 0.8–1.2)
Prothrombin Time: 12.6 s (ref 11.4–15.2)

## 2023-02-23 LAB — D-DIMER, QUANTITATIVE: D-Dimer, Quant: 0.61 ug{FEU}/mL — ABNORMAL HIGH (ref 0.00–0.50)

## 2023-02-23 LAB — APTT: aPTT: 32 s (ref 24–36)

## 2023-02-23 MED ORDER — ENOXAPARIN SODIUM 30 MG/0.3ML IJ SOSY
30.0000 mg | PREFILLED_SYRINGE | INTRAMUSCULAR | Status: DC
  Administered 2023-02-24: 30 mg via SUBCUTANEOUS
  Filled 2023-02-23 (×2): qty 0.3

## 2023-02-23 MED ORDER — DM-GUAIFENESIN ER 30-600 MG PO TB12
1.0000 | ORAL_TABLET | Freq: Two times a day (BID) | ORAL | Status: DC
  Administered 2023-02-23 – 2023-02-25 (×4): 1 via ORAL
  Filled 2023-02-23 (×5): qty 1

## 2023-02-23 MED ORDER — ALBUTEROL SULFATE (2.5 MG/3ML) 0.083% IN NEBU
5.0000 mg | INHALATION_SOLUTION | Freq: Once | RESPIRATORY_TRACT | Status: AC
  Administered 2023-02-23: 5 mg via RESPIRATORY_TRACT
  Filled 2023-02-23: qty 6

## 2023-02-23 MED ORDER — OXYCODONE HCL 5 MG PO TABS
5.0000 mg | ORAL_TABLET | Freq: Once | ORAL | Status: AC
  Administered 2023-02-23: 5 mg via ORAL
  Filled 2023-02-23: qty 1

## 2023-02-23 MED ORDER — SODIUM CHLORIDE 0.9 % IV SOLN: 2.0000 g | INTRAVENOUS | Status: DC

## 2023-02-23 MED ORDER — LACTATED RINGERS IV BOLUS (SEPSIS)
500.0000 mL | Freq: Once | INTRAVENOUS | Status: AC
Start: 2023-02-23 — End: 2023-02-23
  Administered 2023-02-23: 500 mL via INTRAVENOUS

## 2023-02-23 MED ORDER — LOSARTAN POTASSIUM 50 MG PO TABS
50.0000 mg | ORAL_TABLET | Freq: Every day | ORAL | Status: DC
  Administered 2023-02-24 – 2023-02-25 (×2): 50 mg via ORAL
  Filled 2023-02-23 (×3): qty 1

## 2023-02-23 MED ORDER — ASPIRIN 81 MG PO TBEC: 81.0000 mg | DELAYED_RELEASE_TABLET | Freq: Four times a day (QID) | ORAL | Status: DC | PRN

## 2023-02-23 MED ORDER — SODIUM CHLORIDE 0.9 % IV SOLN
2.0000 g | INTRAVENOUS | Status: DC
  Administered 2023-02-23 – 2023-02-24 (×2): 2 g via INTRAVENOUS
  Filled 2023-02-23 (×2): qty 20

## 2023-02-23 MED ORDER — DEXTROSE 5 % IV SOLN: 500.0000 mg | INTRAVENOUS | Status: DC

## 2023-02-23 MED ORDER — SODIUM CHLORIDE 0.9 % IV SOLN
INTRAVENOUS | Status: AC
Start: 2023-02-23 — End: 2023-02-23

## 2023-02-23 MED ORDER — ENSURE ENLIVE PO LIQD
237.0000 mL | Freq: Two times a day (BID) | ORAL | Status: DC
  Administered 2023-02-23 – 2023-02-25 (×4): 237 mL via ORAL

## 2023-02-23 MED ORDER — SODIUM CHLORIDE 0.9% FLUSH
10.0000 mL | Freq: Two times a day (BID) | INTRAVENOUS | Status: DC
  Administered 2023-02-23 – 2023-02-25 (×4): 10 mL via INTRAVENOUS

## 2023-02-23 MED ORDER — DEXTROSE 5 % IV SOLN
500.0000 mg | INTRAVENOUS | Status: DC
  Administered 2023-02-23: 500 mg via INTRAVENOUS
  Filled 2023-02-23 (×2): qty 5

## 2023-02-23 NOTE — ED Provider Notes (Signed)
[facility]    CSN: [id number] Arrival date & time: 02/23/23  0935      History   Chief Complaint Chief Complaint  Patient presents with   Cough    HPI John Luna is a 77 y.o. male.   Patient with history of COPD, CHF presents to urgent care with grandson for evaluation of cough, sore throat, and congestion that started approximately 1.5 years ago.  Grandson, who provides interpretation for visit, is unsure of triggering factor for urgent care visit today but states patient has been more short of breath than normal over the last few weeks.  Grandson and patient are relatively poor historians. Cough sounds wet but is nonproductive and is worse with laying flat.Patient complains of chest discomfort associated with coughing as well as shortness of breath.  Denies leg swelling.  No recent sick contacts with similar symptoms, fevers, chills, nausea, vomiting, abdominal pain, headaches, or extremity weakness. Lucila Maine states he has been neurologically intact to his baseline and without recent mental status changes. Patient smoked 1 pack of cigarettes per day for 50 years, history of COPD.  Lucila Maine has heard wheezing to grandfather's chest and states "I'm not sure what to do about this". They have not attempted use of any over-the-counter medications to help with symptoms PTA.    Cough   Past Medical History:  Diagnosis Date   Acute pulmonary edema (HCC)    Altered mental status    Hyperkalemia 07/09/2019   Irritant contact dermatitis 06/30/2020    Patient Active Problem List   Diagnosis Date Noted   Dysphagia 07/07/2020   Hypertension 06/30/2020   Prediabetes 06/30/2020   Fatigue 10/07/2019   Constipation 06/28/2019   Preventative health care 06/28/2019   Chronic diastolic CHF (congestive heart failure) (HCC) 06/20/2019   Protein-calorie malnutrition, severe 06/20/2019   COPD (chronic obstructive pulmonary disease) (HCC) 09/03/2011    History reviewed. No  pertinent surgical history.     Home Medications    Prior to Admission medications   Medication Sig Start Date End Date Taking? Authorizing Provider  acetaminophen (TYLENOL) 325 MG tablet Take 650 mg by mouth every 6 (six) hours as needed.    [provider]  aspirin EC 81 MG tablet Take 81 mg by mouth every 6 (six) hours as needed for moderate pain.    [provider]  doxycycline (VIBRAMYCIN) 100 MG capsule Take 1 capsule (100 mg total) by mouth 2 (two) times daily. 03/24/22   Piontek, Denny Peon, MD  losartan (COZAAR) 50 MG tablet TAKE 1 TABLET(50 MG) BY MOUTH DAILY 07/27/21   Grayce Sessions, NP  Melatonin 1 MG CHEW Chew 1 tablet by mouth daily. 07/07/20   Seawell, Jaimie A, DO  Multiple Vitamin (MULTIVITAMIN WITH MINERALS) TABS Take 1 tablet by mouth daily.    [provider]    Family History History reviewed. No pertinent family history.  Social History Social History   Tobacco Use   Smoking status: Former    Current packs/day: 0.00    Average packs/day: 1 pack/day for 50.0 years (50.0 ttl pk-yrs)    Types: Cigarettes    Start date: 09/12/1961    Quit date: 09/13/2011    Years since quitting: 11.4   Smokeless tobacco: Never  Vaping Use   Vaping status: Never Used  Substance Use Topics   Alcohol use: Yes    Comment: occ   Drug use: No     Allergies   Patient has no known allergies.  Review of Systems Review of Systems  Respiratory:  Positive for cough.   Per HPI   Physical Exam Triage Vital Signs ED Triage Vitals  Encounter Vitals Group     BP 02/23/23 1039 125/74     Systolic BP Percentile --      Diastolic BP Percentile --      Pulse Rate 02/23/23 1039 98     Resp 02/23/23 1039 16     Temp 02/23/23 1039 97.6 F (36.4 C)     Temp Source 02/23/23 1039 Oral     SpO2 02/23/23 1039 93 %     Weight --      Height --      Head Circumference --      Peak Flow --      Pain Score 02/23/23 1040 9     Pain Loc --      Pain  Education --      Exclude from Growth Chart --    No data found.  Updated Vital Signs BP 125/74 (BP Location: Left Arm)   Pulse 98   Temp 97.6 F (36.4 C) (Oral)   Resp 16   SpO2 93%   Visual Acuity Right Eye Distance:   Left Eye Distance:   Bilateral Distance:    Right Eye Near:   Left Eye Near:    Bilateral Near:     Physical Exam Vitals and nursing note reviewed.  Constitutional:      Appearance: He is underweight. He is ill-appearing. He is not toxic-appearing.  HENT:     Head: Normocephalic and atraumatic.     Right Ear: Hearing and external ear normal.     Left Ear: Hearing and external ear normal.     Nose: Nose normal.     Mouth/Throat:     Lips: Pink.     Mouth: Mucous membranes are moist. No injury.     Tongue: No lesions. Tongue does not deviate from midline.     Palate: No mass and lesions.     Pharynx: Oropharynx is clear. Uvula midline. No pharyngeal swelling, oropharyngeal exudate, posterior oropharyngeal erythema or uvula swelling.     Tonsils: No tonsillar exudate or tonsillar abscesses.  Eyes:     General: Lids are normal. Vision grossly intact. Gaze aligned appropriately.     Extraocular Movements: Extraocular movements intact.     Conjunctiva/sclera: Conjunctivae normal.  Cardiovascular:     Rate and Rhythm: Normal rate and regular rhythm.     Heart sounds: Normal heart sounds, S1 normal and S2 normal.  Pulmonary:     Effort: Pulmonary effort is normal. No respiratory distress.     Breath sounds: Normal air entry. Wheezing, rhonchi and rales present.  Chest:     Chest wall: No tenderness.  Musculoskeletal:     Cervical back: Neck supple.  Skin:    General: Skin is warm and dry.     Capillary Refill: Capillary refill takes less than 2 seconds.     Findings: No rash.  Neurological:     General: No focal deficit present.     Mental Status: He is alert and oriented to person, place, and time. Mental status is at baseline.     Cranial Nerves:  No dysarthria or facial asymmetry.  Psychiatric:        Mood and Affect: Mood normal.        Speech: Speech normal.        Behavior: Behavior normal.  Thought Content: Thought content normal.        Judgment: Judgment normal.      UC Treatments / Results  Labs (all labs ordered are listed, but only abnormal results are displayed) Labs Reviewed - No data to display  EKG   Radiology No results found.  Procedures Procedures (including critical care time)  Medications Ordered in UC Medications - No data to display  Initial Impression / Assessment and Plan / UC Course  I have reviewed the triage vital signs and the nursing notes.  Pertinent labs & imaging results that were available during my care of the patient were reviewed by me and considered in my medical decision making (see chart for details).   1. Chronic cough, shortness of breath Chest x-ray shows chronic findings that are more prominent as compared to x-ray reviewed from 2023. Patient is ill-appearing, although vitals are hemodynamically stable. Patient would benefit from further workup in the ER setting to differentiate between COPD exacerbation versus acute pneumonia.  Grandson agrees to bring patient to ER for further workup and patient expresses understanding and agreement with plan. Discharged from urgent care to ED via POV.  Final Clinical Impressions(s) / UC Diagnoses   Final diagnoses:  Chronic cough  Shortness of breath     Discharge Instructions      Please go to the nearest ER for further evaluation.      ED Prescriptions   None    PDMP not reviewed this encounter.   Carlisle Beers, [place] 02/23/23 1336

## 2023-02-23 NOTE — ED Provider Notes (Addendum)
[name] AT [facility] Provider Note   CSN: [id number] Arrival date & time: 02/23/23  1156     History  Chief Complaint  Patient presents with   Cough    John Luna is a 77 y.o. male.  Patient is a 77 year old male who presents with a cough.  History is obtained through family member interpreter given that this particular language is not on our video language interpreter platform.  He states that patient has had a cough.  He actually was seen about a year ago for pneumonia and treated for pneumonia but has had an ongoing cough since that time although its gotten worse over the last couple weeks.  He now has increasing shortness of breath.  He has some pain across his ribs bilaterally.  He has not had any recent fevers but he does not have a good appetite.  No leg swelling.  No vomiting.  No known history of underlying lung disease although he has been a smoker.  He quit 6 years ago.       Home Medications Prior to Admission medications   Medication Sig Start Date End Date Taking? Authorizing Provider  acetaminophen (TYLENOL) 325 MG tablet Take 650 mg by mouth every 6 (six) hours as needed.    [provider]  aspirin EC 81 MG tablet Take 81 mg by mouth every 6 (six) hours as needed for moderate pain.    [provider]  doxycycline (VIBRAMYCIN) 100 MG capsule Take 1 capsule (100 mg total) by mouth 2 (two) times daily. 03/24/22   Piontek, Denny Peon, MD  losartan (COZAAR) 50 MG tablet TAKE 1 TABLET(50 MG) BY MOUTH DAILY Patient taking differently: Take 50 mg by mouth daily. 07/27/21   Grayce Sessions, NP  Melatonin 1 MG CHEW Chew 1 tablet by mouth daily. 07/07/20   Seawell, Jaimie A, DO  Multiple Vitamin (MULTIVITAMIN WITH MINERALS) TABS Take 1 tablet by mouth daily.    [provider]      Allergies    Patient has no known allergies.    Review of Systems   Review of Systems  Constitutional:  Positive for activity  change, appetite change and fatigue. Negative for chills, diaphoresis and fever.  HENT:  Negative for congestion, rhinorrhea and sneezing.   Eyes: Negative.   Respiratory:  Positive for cough and shortness of breath. Negative for chest tightness.   Cardiovascular:  Positive for chest pain. Negative for leg swelling.  Gastrointestinal:  Negative for abdominal pain, blood in stool, diarrhea, nausea and vomiting.  Genitourinary:  Negative for difficulty urinating, flank pain, frequency and hematuria.  Musculoskeletal:  Negative for arthralgias and back pain.  Skin:  Negative for rash.  Neurological:  Negative for dizziness, speech difficulty, weakness, numbness and headaches.    Physical Exam Updated Vital Signs BP 133/68   Pulse (!) 107   Temp (!) 97.4 F (36.3 C) (Oral)   Resp (!) 22   Ht 4\' 11"  (1.499 m)   Wt 41.3 kg   SpO2 100%   BMI 18.39 kg/m  Physical Exam Constitutional:      Appearance: He is well-developed.  HENT:     Head: Normocephalic and atraumatic.  Eyes:     Pupils: Pupils are equal, round, and reactive to light.  Cardiovascular:     Rate and Rhythm: Normal rate and regular rhythm.     Heart sounds: Normal heart sounds.  Pulmonary:     Effort: Pulmonary effort is normal.  No respiratory distress.     Breath sounds: Wheezing and rhonchi present. No rales.     Comments: Tachypnea Chest:     Chest wall: No tenderness.  Abdominal:     General: Bowel sounds are normal.     Palpations: Abdomen is soft.     Tenderness: There is no abdominal tenderness. There is no guarding or rebound.  Musculoskeletal:        General: Normal range of motion.     Cervical back: Normal range of motion and neck supple.  Lymphadenopathy:     Cervical: No cervical adenopathy.  Skin:    General: Skin is warm and dry.     Findings: No rash.     Comments: No edema or calf tenderness to his lower extremities  Neurological:     Mental Status: He is alert and oriented to person, place,  and time.     ED Results / Procedures / Treatments   Labs (all labs ordered are listed, but only abnormal results are displayed) Labs Reviewed  COMPREHENSIVE METABOLIC PANEL - Abnormal; Notable for the following components:      Result Value   Sodium 129 (*)    CO2 20 (*)    BUN 7 (*)    Calcium 8.8 (*)    Albumin 3.0 (*)    Total Bilirubin 1.6 (*)    All other components within normal limits  CBC WITH DIFFERENTIAL/PLATELET - Abnormal; Notable for the following components:   MCV 79.3 (*)    MCH 25.5 (*)    Abs Immature Granulocytes 0.09 (*)    All other components within normal limits  I-STAT CG4 LACTIC ACID, ED - Abnormal; Notable for the following components:   Lactic Acid, Venous 2.5 (*)    All other components within normal limits  TROPONIN I (HIGH SENSITIVITY) - Abnormal; Notable for the following components:   Troponin I (High Sensitivity) 25 (*)    All other components within normal limits  CULTURE, BLOOD (ROUTINE X 2)  CULTURE, BLOOD (ROUTINE X 2)  RESP PANEL BY RT-PCR (RSV, FLU A&B, COVID)  RVPGX2  EXPECTORATED SPUTUM ASSESSMENT W GRAM STAIN, RFLX TO RESP C  PROTIME-INR  APTT  URINALYSIS, W/ REFLEX TO CULTURE (INFECTION SUSPECTED)  HIV ANTIBODY (ROUTINE TESTING W REFLEX)  CBC  CREATININE, SERUM  STREP PNEUMONIAE URINARY ANTIGEN  LEGIONELLA PNEUMOPHILA SEROGP 1 UR AG  SODIUM, URINE, RANDOM  OSMOLALITY  OSMOLALITY, URINE  I-STAT CG4 LACTIC ACID, ED  TROPONIN I (HIGH SENSITIVITY)    EKG EKG Interpretation Date/Time:  Thursday February 23 2023 13:52:43 EDT Ventricular Rate:  94 PR Interval:  172 QRS Duration:  90 QT Interval:  355 QTC Calculation: 444 R Axis:   82  Text Interpretation: Sinus rhythm Biatrial enlargement Borderline right axis deviation SINCE LAST TRACING HEART RATE HAS INCREASED Confirmed by Rolan Bucco [phone]) on 02/23/2023 1:56:38 PM  Radiology DG Chest 2 View  Result Date: 02/23/2023 CLINICAL DATA:  Sepsis. EXAM: CHEST - 2 VIEW  COMPARISON:  X-ray 02/23/2023 earlier and older. FINDINGS: Hyperinflation. Normal cardiopericardial silhouette. Right-sided small effusion or pleural thickening with some patchy hazy right midlung opacity. There also some opacity left lung base with what may be a rounded nodular area. A cavitary focus is not excluded. Recommend follow-up. No edema or pneumothorax. IMPRESSION: Hazy right midlung and left lung base opacities. Possible infiltrate. At the left lung base as well as a somewhat rounded opacity which may be cavitary. Recommend further workup with CT when appropriate.  Stable right-sided small effusion or pleural thickening. Hyperinflation with chronic changes. Electronically Signed   By: Karen Kays M.D.   On: 02/23/2023 15:24   DG Chest 2 View  Result Date: 02/23/2023 CLINICAL DATA:  cough for a year EXAM: CHEST - 2 VIEW COMPARISON:  CXR 03/24/22 FINDINGS: Small bilateral pleural effusions versus scarring. Redemonstrated calcified pleural plaque along the right lower lung field. Compared to prior exam there is increased hazy opacity at the left lung base that could represent progressive atelectasis or infection. No radiographically apparent displaced rib fractures. Visualized upper abdomen unremarkable. Vertebral body heights are maintained IMPRESSION: Increased hazy opacity at the left lung base could represent progressive atelectasis or infection. Electronically Signed   By: Lorenza Cambridge M.D.   On: 02/23/2023 13:40    Procedures Procedures    Medications Ordered in ED Medications  sodium chloride flush (NS) 0.9 % injection 10 mL (10 mLs Intravenous Not Given 02/23/23 1423)  cefTRIAXone (ROCEPHIN) 2 g in sodium chloride 0.9 % 100 mL IVPB (0 g Intravenous Stopped 02/23/23 1450)  azithromycin (ZITHROMAX) 500 mg in dextrose 5 % 250 mL IVPB (500 mg Intravenous New Bag/Given 02/23/23 1448)  aspirin EC tablet 81 mg (has no administration in time range)  losartan (COZAAR) tablet 50 mg (has no  administration in time range)  enoxaparin (LOVENOX) injection 30 mg (has no administration in time range)  0.9 %  sodium chloride infusion (has no administration in time range)  feeding supplement (ENSURE ENLIVE / ENSURE PLUS) liquid 237 mL (has no administration in time range)  albuterol (PROVENTIL) (2.5 MG/3ML) 0.083% nebulizer solution 5 mg (5 mg Nebulization Given 02/23/23 1425)  lactated ringers bolus 500 mL (0 mLs Intravenous Stopped 02/23/23 1534)    ED Course/ Medical Decision Making/ A&P                                 Medical Decision Making Amount and/or Complexity of Data Reviewed Labs: ordered. Radiology: ordered. ECG/medicine tests: ordered.  Risk Prescription drug management. Decision regarding hospitalization.   Patient is a 78 year old who presents with worsening cough and shortness of breath.  He is not requiring oxygen but is a little tachypneic and tachycardic on arrival.  He has some mild increased work of breathing.  He has some generalized rhonchi and a little bit of wheezing.  Chest x-ray shows probable infiltrate.  This was interpreted by me and confirmed by the radiologist.  His labs show mild elevation in his lactic acid although this is cleared after IV fluids.  Sodium is low.  He does not have other symptoms that sound more concerning for PE.  He has a productive cough and his symptoms sound consistent with pneumonia.  He was started on IV antibiotics.  I spoke with the hospitalist who will admit the patient for further treatment.  Final Clinical Impression(s) / ED Diagnoses Final diagnoses:  Community acquired pneumonia, unspecified laterality    Rx / DC Orders ED Discharge Orders     None         Rolan Bucco, MD 02/23/23 1544    Rolan Bucco, MD 02/23/23 1545

## 2023-02-23 NOTE — Sepsis Progress Note (Signed)
Sepsis protocol monitored by eLink ?

## 2023-02-23 NOTE — ED Notes (Signed)
Called and let floor know patient would be coming up shortly at the 40 min mark.

## 2023-02-23 NOTE — ED Triage Notes (Signed)
Pt sent by UC. Patient has had a cough x 1 year and grandson states that the grandmother told him that the patient has had untreated pneumonia. Patient reports that he has pain in his chest, back and abdomen when he coughs. Pt has moist cough, but not coughing anything up. Pt has been having SOB. Pt is eupneic.

## 2023-02-23 NOTE — ED Triage Notes (Signed)
Patient's grandson is translating for the patient.  Patient has had a cough x 1 year and grandson states that the grandmother told him that the patient has had untreated pneumonia. Patient reports that he has pain in his chest, back and abdomen when he coughs.

## 2023-02-23 NOTE — H&P (Addendum)
History and Physical    John Luna ION:[id number] DOB: 05/09/1945 DOA: 02/23/2023  PCP: Grayce Sessions, NP  Patient coming from: Home  I have personally briefly reviewed patient's old medical records in [facility]  Chief Complaint: Cough  HPI: John Luna is a 77 y.o. male with medical history significant of hypertension and pneumonia was brought into the ED due to persistent cough.  Patient speaks [place], unfortunately we do not have interpreter for that.  I initially called his son John Luna and left a voicemail.  I then called his grandson John Luna and left a voicemail to him.  When I saw him in the emergency department, there was no family member present at the bedside.  According to ED physician, patient has been having cough for more than a year which has gotten worse lately with some shortness of breath.  No reports of fever or any other complaint.  Later on, I received a call back from John Luna who mentioned that patient actually has been having complaints of chest pain lately and he has been describing it as sharp and intermittent, not really related to his cough.  He is not bringing up any phlegm.  He confirmed rest of the history.  Although he did not seem to be much familiar with patient's past medical history such as hypertension.  By the time I saw patient, he appeared very comfortable.  ED Course: Upon arrival to ED, patient was tachycardic and tachypneic, no leukocytosis and BMP was showing mild hyponatremia but chest x-ray confirmed left lower lobe infiltrate.  His lactic acid was 2.5, patient was given 500 cc fluid bolus and repeat lactic acid was 0.9.  He was given only albuterol.  There is an order for Rocephin and Zithromax but by the time I saw him, those antibiotics have not been given to him.  Hospitalist were called for admission.  Review of Systems: As per HPI otherwise negative.    Past Medical History:  Diagnosis Date   Acute pulmonary edema (HCC)     Altered mental status    Hyperkalemia 07/09/2019   Irritant contact dermatitis 06/30/2020    History reviewed. No pertinent surgical history.   reports that he quit smoking about 11 years ago. His smoking use included cigarettes. He started smoking about 61 years ago. He has a 50 pack-year smoking history. He has never used smokeless tobacco. He reports current alcohol use. He reports that he does not use drugs.  No Known Allergies  History reviewed. No pertinent family history.  Prior to Admission medications   Medication Sig Start Date End Date Taking? Authorizing Provider  acetaminophen (TYLENOL) 325 MG tablet Take 650 mg by mouth every 6 (six) hours as needed.    [provider]  aspirin EC 81 MG tablet Take 81 mg by mouth every 6 (six) hours as needed for moderate pain.    [provider]  doxycycline (VIBRAMYCIN) 100 MG capsule Take 1 capsule (100 mg total) by mouth 2 (two) times daily. 03/24/22   Piontek, Denny Peon, MD  losartan (COZAAR) 50 MG tablet TAKE 1 TABLET(50 MG) BY MOUTH DAILY Patient taking differently: Take 50 mg by mouth daily. 07/27/21   Grayce Sessions, NP  Melatonin 1 MG CHEW Chew 1 tablet by mouth daily. 07/07/20   Seawell, Jaimie A, DO  Multiple Vitamin (MULTIVITAMIN WITH MINERALS) TABS Take 1 tablet by mouth daily.    [provider]    Physical Exam: Vitals:   02/23/23 1320 02/23/23 1430  02/23/23 1500 02/23/23 1530  BP:  137/65 134/64 133/68  Pulse:  86 (!) 107 (!) 107  Resp:  15 (!) 25 (!) 22  Temp: (!) 97.4 F (36.3 C)     TempSrc: Oral     SpO2:  100% 100% 100%  Weight:      Height:        Constitutional: NAD, calm, comfortable Vitals:   02/23/23 1320 02/23/23 1430 02/23/23 1500 02/23/23 1530  BP:  137/65 134/64 133/68  Pulse:  86 (!) 107 (!) 107  Resp:  15 (!) 25 (!) 22  Temp: (!) 97.4 F (36.3 C)     TempSrc: Oral     SpO2:  100% 100% 100%  Weight:      Height:       Eyes: PERRL, lids and conjunctivae normal,  patient coughing ENMT: Mucous membranes are moist. Posterior pharynx clear of any exudate or lesions.Normal dentition.  Neck: normal, supple, no masses, no thyromegaly Respiratory: Rhonchi at the left middle lobe. Cardiovascular: Regular rate and rhythm, no murmurs / rubs / gallops. No extremity edema. 2+ pedal pulses. No carotid bruits.  Abdomen: no tenderness, no masses palpated. No hepatosplenomegaly. Bowel sounds positive.  Musculoskeletal: no clubbing / cyanosis. No joint deformity upper and lower extremities. Good ROM, no contractures. Normal muscle tone.  Skin: no rashes, lesions, ulcers. No induration Neurologic: CN 2-12 grossly intact. Sensation intact, DTR normal. Strength 5/5 in all 4.  Psychiatric: Normal judgment and insight. Alert and oriented x 3. Normal mood.    Labs on Admission: I have personally reviewed following labs and imaging studies  CBC: Recent Labs  Lab 02/23/23 1241  WBC 8.4  NEUTROABS 6.8  HGB 13.2  HCT 41.1  MCV 79.3*  PLT 160   Basic Metabolic Panel: Recent Labs  Lab 02/23/23 1241  NA 129*  K 4.3  CL 98  CO2 20*  GLUCOSE 86  BUN 7*  CREATININE 0.78  CALCIUM 8.8*   GFR: Estimated Creatinine Clearance: 47.3 mL/min (by C-G formula based on SCr of 0.78 mg/dL). Liver Function Tests: Recent Labs  Lab 02/23/23 1241  AST 23  ALT 12  ALKPHOS 78  BILITOT 1.6*  PROT 7.0  ALBUMIN 3.0*   No results for input(s): "LIPASE", "AMYLASE" in the last 168 hours. No results for input(s): "AMMONIA" in the last 168 hours. Coagulation Profile: Recent Labs  Lab 02/23/23 1241  INR 0.9   Cardiac Enzymes: No results for input(s): "CKTOTAL", "CKMB", "CKMBINDEX", "TROPONINI" in the last 168 hours. BNP (last 3 results) No results for input(s): "PROBNP" in the last 8760 hours. HbA1C: No results for input(s): "HGBA1C" in the last 72 hours. CBG: No results for input(s): "GLUCAP" in the last 168 hours. Lipid Profile: No results for input(s): "CHOL",  "HDL", "LDLCALC", "TRIG", "CHOLHDL", "LDLDIRECT" in the last 72 hours. Thyroid Function Tests: No results for input(s): "TSH", "T4TOTAL", "FREET4", "T3FREE", "THYROIDAB" in the last 72 hours. Anemia Panel: No results for input(s): "VITAMINB12", "FOLATE", "FERRITIN", "TIBC", "IRON", "RETICCTPCT" in the last 72 hours. Urine analysis:    Component Value Date/Time   COLORURINE STRAW (A) 06/19/2019 1700   APPEARANCEUR CLEAR 06/19/2019 1700   LABSPEC 1.008 06/19/2019 1700   PHURINE 8.0 06/19/2019 1700   GLUCOSEU NEGATIVE 06/19/2019 1700   HGBUR NEGATIVE 06/19/2019 1700   BILIRUBINUR NEGATIVE 06/19/2019 1700   KETONESUR NEGATIVE 06/19/2019 1700   PROTEINUR NEGATIVE 06/19/2019 1700   UROBILINOGEN 0.2 06/16/2012 1014   NITRITE NEGATIVE 06/19/2019 1700   LEUKOCYTESUR NEGATIVE 06/19/2019  1700    Radiological Exams on Admission: DG Chest 2 View  Result Date: 02/23/2023 CLINICAL DATA:  Sepsis. EXAM: CHEST - 2 VIEW COMPARISON:  X-ray 02/23/2023 earlier and older. FINDINGS: Hyperinflation. Normal cardiopericardial silhouette. Right-sided small effusion or pleural thickening with some patchy hazy right midlung opacity. There also some opacity left lung base with what may be a rounded nodular area. A cavitary focus is not excluded. Recommend follow-up. No edema or pneumothorax. IMPRESSION: Hazy right midlung and left lung base opacities. Possible infiltrate. At the left lung base as well as a somewhat rounded opacity which may be cavitary. Recommend further workup with CT when appropriate. Stable right-sided small effusion or pleural thickening. Hyperinflation with chronic changes. Electronically Signed   By: Karen Kays M.D.   On: 02/23/2023 15:24   DG Chest 2 View  Result Date: 02/23/2023 CLINICAL DATA:  cough for a year EXAM: CHEST - 2 VIEW COMPARISON:  CXR 03/24/22 FINDINGS: Small bilateral pleural effusions versus scarring. Redemonstrated calcified pleural plaque along the right lower lung field.  Compared to prior exam there is increased hazy opacity at the left lung base that could represent progressive atelectasis or infection. No radiographically apparent displaced rib fractures. Visualized upper abdomen unremarkable. Vertebral body heights are maintained IMPRESSION: Increased hazy opacity at the left lung base could represent progressive atelectasis or infection. Electronically Signed   By: Lorenza Cambridge M.D.   On: 02/23/2023 13:40    EKG: Independently reviewed.  Sinus rhythm with no acute ST-T wave changes.  Assessment/Plan Principal Problem:   CAP (community acquired pneumonia) Active Problems:   Protein-calorie malnutrition, severe   Hyponatremia   Hypertension   Severe sepsis (HCC)     Severe sepsis secondary to community-acquired pneumonia, POA: Patient met criteria for severe sepsis based on tachycardia, tachypnea and lactic acid of 2.5.  Lactic acidosis has improved already and sepsis physiology has also improved already.  He appears comfortable.  Continue Rocephin, Zithromax, check sputum culture, urine antigen for Legionella and streptococci as well as blood culture.  He is not hypoxic.  I hope that he will be tuned up enough to be able to go home tomorrow.  I will give him another IV fluid bolus of 500 cc.  COVID, flu and RSV is still in process.  I will check respiratory viral panel.  Chest pain: Patient appeared comfortable.  First set of troponin is only 25.  I think this is likely pleuritic/costochondritic pain however PE cannot be ruled out.  Will check 1 more set of troponin.  If flat, no further workup indicated for cardiac etiology but I will check D-dimer, if elevated will need to proceed with CTA.  Essential hypertension: Will resume Cozaar.  Mild acute hyponatremia: 129.  Could be SIADH.  Check urine and serum osmolality and urine spot sodium.  Severe protein calorie malnutrition: BMI 18.3.  I have ordered Ensure twice daily and have consulted  dietitian.  DVT prophylaxis: enoxaparin (LOVENOX) injection 30 mg Start: 02/23/23 1545 Code Status: Full code Family Communication: None present at bedside.  Plan of care discussed with grandson. Disposition Plan: Potential discharge tomorrow. Consults called: None  Hughie Closs MD [facility]  *Please note that this is a verbal dictation therefore any spelling or grammatical errors are due to the "Dragon Medical One" system interpretation.  Please page via Amion and do not message via secure chat for urgent patient care matters. Secure chat can be used for non urgent patient care matters. 02/23/2023, 3:43 PM  To contact the attending provider between 7A-7P or the covering provider during after hours 7P-7A, please log into the web site [email or web address]

## 2023-02-23 NOTE — ED Notes (Signed)
ED TO INPATIENT HANDOFF REPORT  ED Nurse Name and Phone #: Irving Burton RN [phone]  S Name/Age/Gender John Luna 76 y.o. male Room/Bed: 017C/017C  Code Status   Code Status: Full Code  Home/SNF/Other Home Patient oriented to: self, place, time, and situation Is this baseline? Yes   Triage Complete: Triage complete  Chief Complaint CAP (community acquired pneumonia) [J18.9]  Triage Note Pt sent by UC. Patient has had a cough x 1 year and grandson states that the grandmother told him that the patient has had untreated pneumonia. Patient reports that he has pain in his chest, back and abdomen when he coughs. Pt has moist cough, but not coughing anything up. Pt has been having SOB. Pt is eupneic.   Allergies No Known Allergies  Level of Care/Admitting Diagnosis ED Disposition     ED Disposition  Admit   Condition  --   Comment  Hospital Area: [facility] [[id number]]  Level of Care: Med-Surg [16]  May place patient in observation at [facility] or [facility] if equivalent level of care is available:: No  Covid Evaluation: Symptomatic Person Under Investigation (PUI) or recent exposure (last 10 days) *Testing Required*  Diagnosis: CAP (community acquired pneumonia) [347425]  Admitting Physician: Hughie Closs [[id number]]  Attending Physician: Hughie Closs [[id number]]          B Medical/Surgery History Past Medical History:  Diagnosis Date   Acute pulmonary edema (HCC)    Altered mental status    Hyperkalemia 07/09/2019   Irritant contact dermatitis 06/30/2020   History reviewed. No pertinent surgical history.   A IV Location/Drains/Wounds Patient Lines/Drains/Airways Status     Active Line/Drains/Airways     Name Placement date Placement time Site Days   Peripheral IV 02/23/23 20 G Anterior;Right;Upper Arm 02/23/23  1410  Arm  less than 1   Peripheral IV 02/23/23 20 G Anterior;Left Forearm 02/23/23  1410  Forearm  less than 1             Intake/Output Last 24 hours  Intake/Output Summary (Last 24 hours) at 02/23/2023 1534 Last data filed at 02/23/2023 1534 Gross per 24 hour  Intake 600.06 ml  Output --  Net 600.06 ml    Labs/Imaging Results for orders placed or performed during the hospital encounter of 02/23/23 (from the past 48 hour(s))  Comprehensive metabolic panel     Status: Abnormal   Collection Time: 02/23/23 12:41 PM  Result Value Ref Range   Sodium 129 (L) 135 - 145 mmol/L   Potassium 4.3 3.5 - 5.1 mmol/L   Chloride 98 98 - 111 mmol/L   CO2 20 (L) 22 - 32 mmol/L   Glucose, Bld 86 70 - 99 mg/dL    Comment: Glucose reference range applies only to samples taken after fasting for at least 8 hours.   BUN 7 (L) 8 - 23 mg/dL   Creatinine, Ser 1.88 0.61 - 1.24 mg/dL   Calcium 8.8 (L) 8.9 - 10.3 mg/dL   Total Protein 7.0 6.5 - 8.1 g/dL   Albumin 3.0 (L) 3.5 - 5.0 g/dL   AST 23 15 - 41 U/L   ALT 12 0 - 44 U/L   Alkaline Phosphatase 78 38 - 126 U/L   Total Bilirubin 1.6 (H) 0.3 - 1.2 mg/dL   GFR, Estimated >41 >66 mL/min    Comment: (NOTE) Calculated using the CKD-EPI Creatinine Equation (2021)    Anion gap 11 5 - 15    Comment: Performed at [facility]  Hospital Lab, [address], [place], [place] [address]  CBC with Differential     Status: Abnormal   Collection Time: 02/23/23 12:41 PM  Result Value Ref Range   WBC 8.4 4.0 - 10.5 K/uL   RBC 5.18 4.22 - 5.81 MIL/uL   Hemoglobin 13.2 13.0 - 17.0 g/dL   HCT 29.5 62.1 - 30.8 %   MCV 79.3 (L) 80.0 - 100.0 fL   MCH 25.5 (L) 26.0 - 34.0 pg   MCHC 32.1 30.0 - 36.0 g/dL   RDW 65.7 84.6 - 96.2 %   Platelets 160 150 - 400 K/uL    Comment: REPEATED TO VERIFY   nRBC 0.0 0.0 - 0.2 %   Neutrophils Relative % 80 %   Neutro Abs 6.8 1.7 - 7.7 K/uL   Lymphocytes Relative 9 %   Lymphs Abs 0.7 0.7 - 4.0 K/uL   Monocytes Relative 8 %   Monocytes Absolute 0.7 0.1 - 1.0 K/uL   Eosinophils Relative 1 %   Eosinophils Absolute 0.1 0.0 - 0.5 K/uL   Basophils Relative  1 %   Basophils Absolute 0.1 0.0 - 0.1 K/uL   Immature Granulocytes 1 %   Abs Immature Granulocytes 0.09 (H) 0.00 - 0.07 K/uL    Comment: Performed at [facility], [address], [place], [place] [address]  Protime-INR     Status: None   Collection Time: 02/23/23 12:41 PM  Result Value Ref Range   Prothrombin Time 12.6 11.4 - 15.2 seconds   INR 0.9 0.8 - 1.2    Comment: (NOTE) INR goal varies based on device and disease states. Performed at [facility], [address] [place], [place] [address]   I-Stat Lactic Acid, ED     Status: Abnormal   Collection Time: 02/23/23 12:58 PM  Result Value Ref Range   Lactic Acid, Venous 2.5 (HH) 0.5 - 1.9 mmol/L   Comment NOTIFIED PHYSICIAN   APTT     Status: None   Collection Time: 02/23/23  2:17 PM  Result Value Ref Range   aPTT 32 24 - 36 seconds    Comment: Performed at [facility], [address] [place], [place] [address]  I-Stat Lactic Acid, ED     Status: None   Collection Time: 02/23/23  2:23 PM  Result Value Ref Range   Lactic Acid, Venous 0.9 0.5 - 1.9 mmol/L   DG Chest 2 View  Result Date: 02/23/2023 CLINICAL DATA:  Sepsis. EXAM: CHEST - 2 VIEW COMPARISON:  X-ray 02/23/2023 earlier and older. FINDINGS: Hyperinflation. Normal cardiopericardial silhouette. Right-sided small effusion or pleural thickening with some patchy hazy right midlung opacity. There also some opacity left lung base with what may be a rounded nodular area. A cavitary focus is not excluded. Recommend follow-up. No edema or pneumothorax. IMPRESSION: Hazy right midlung and left lung base opacities. Possible infiltrate. At the left lung base as well as a somewhat rounded opacity which may be cavitary. Recommend further workup with CT when appropriate. Stable right-sided small effusion or pleural thickening. Hyperinflation with chronic changes. Electronically Signed   By: Karen Kays M.D.   On: 02/23/2023 15:24   DG Chest 2 View  Result Date:  02/23/2023 CLINICAL DATA:  cough for a year EXAM: CHEST - 2 VIEW COMPARISON:  CXR 03/24/22 FINDINGS: Small bilateral pleural effusions versus scarring. Redemonstrated calcified pleural plaque along the right lower lung field. Compared to prior exam there is increased hazy opacity at the  left lung base that could represent progressive atelectasis or infection. No radiographically apparent displaced rib fractures. Visualized upper abdomen unremarkable. Vertebral body heights are maintained IMPRESSION: Increased hazy opacity at the left lung base could represent progressive atelectasis or infection. Electronically Signed   By: Lorenza Cambridge M.D.   On: 02/23/2023 13:40    Pending Labs Unresulted Labs (From admission, onward)     Start     Ordered   03/02/23 0500  Creatinine, serum  (enoxaparin (LOVENOX)    CrCl >/= 30 ml/min)  Weekly,   R     Comments: while on enoxaparin therapy    02/23/23 1534   02/24/23 0500  Comprehensive metabolic panel  Tomorrow morning,   R        02/23/23 1534   02/24/23 0500  CBC  Tomorrow morning,   R        02/23/23 1534   02/23/23 1535  Strep pneumoniae urinary antigen  Once,   R        02/23/23 1534   02/23/23 1535  Legionella Pneumophila Serogp 1 Ur Ag  Once,   R        02/23/23 1534   02/23/23 1535  Expectorated Sputum Assessment w Gram Stain, Rflx to Resp Cult  Once,   R        02/23/23 1534   02/23/23 1532  HIV Antibody (routine testing w rflx)  (HIV Antibody (Routine testing w reflex) panel)  Once,   R        02/23/23 1534   02/23/23 1532  CBC  (enoxaparin (LOVENOX)    CrCl >/= 30 ml/min)  Once,   R       Comments: Baseline for enoxaparin therapy IF NOT ALREADY DRAWN.  Notify MD if PLT < 100 K.    02/23/23 1534   02/23/23 1532  Creatinine, serum  (enoxaparin (LOVENOX)    CrCl >/= 30 ml/min)  Once,   R       Comments: Baseline for enoxaparin therapy IF NOT ALREADY DRAWN.    02/23/23 1534   02/23/23 1337  Resp panel by RT-PCR (RSV, Flu A&B, Covid) Anterior  Nasal Swab  (Septic presentation on arrival (screening labs, nursing and treatment orders for obvious sepsis))  Once,   URGENT        02/23/23 1338   02/23/23 1221  Culture, blood (Routine x 2)  BLOOD CULTURE X 2,   R (with STAT occurrences)      02/23/23 1220   02/23/23 1221  Urinalysis, w/ Reflex to Culture (Infection Suspected) -Urine, Clean Catch  Once,   URGENT       Question:  Specimen Source  Answer:  Urine, Clean Catch   02/23/23 1220            Vitals/Pain Today's Vitals   02/23/23 1320 02/23/23 1430 02/23/23 1500 02/23/23 1530  BP:  137/65 134/64 133/68  Pulse:  86 (!) 107 (!) 107  Resp:  15 (!) 25 (!) 22  Temp: (!) 97.4 F (36.3 C)     TempSrc: Oral     SpO2:  100% 100% 100%  Weight:      Height:        Isolation Precautions No active isolations  Medications Medications  sodium chloride flush (NS) 0.9 % injection 10 mL (10 mLs Intravenous Not Given 02/23/23 1423)  cefTRIAXone (ROCEPHIN) 2 g in sodium chloride 0.9 % 100 mL IVPB (0 g Intravenous Stopped 02/23/23 1450)  azithromycin (ZITHROMAX) 500 mg  in dextrose 5 % 250 mL IVPB (500 mg Intravenous New Bag/Given 02/23/23 1448)  aspirin EC tablet 81 mg (has no administration in time range)  losartan (COZAAR) tablet 50 mg (has no administration in time range)  enoxaparin (LOVENOX) injection 40 mg (has no administration in time range)  0.9 %  sodium chloride infusion (has no administration in time range)  cefTRIAXone (ROCEPHIN) 2 g in sodium chloride 0.9 % 100 mL IVPB (has no administration in time range)  azithromycin (ZITHROMAX) 500 mg in dextrose 5 % 250 mL IVPB (has no administration in time range)  albuterol (PROVENTIL) (2.5 MG/3ML) 0.083% nebulizer solution 5 mg (5 mg Nebulization Given 02/23/23 1425)  lactated ringers bolus 500 mL (0 mLs Intravenous Stopped 02/23/23 1534)    Mobility walks     Focused Assessments Pulmonary Assessment Handoff:  Lung sounds: Bilateral Breath Sounds: Rhonchi O2 Device:  Room Air      R Recommendations: See Admitting Provider Note  Report given to:   Additional Notes: Pt speaks [place] w/ [place] dialect. Please call pt's grandson Apolinar Junes upon admission to unit. The number is in the chart and also in a blank note that I entered. IV's won't pull back blood, so pt will have to be stuck for labs.

## 2023-02-23 NOTE — ED Notes (Signed)
Pt's grandson, Apolinar Junes, would like to be updated when the results come back in so he can talk to his grandfather about the results.

## 2023-02-23 NOTE — Discharge Instructions (Signed)
Please go to the nearest ER for further evaluation.

## 2023-02-23 NOTE — ED Notes (Signed)
John Luna [phone] (pt's son to translate if needed)

## 2023-02-23 NOTE — ED Notes (Signed)
Patient is being discharged from the Urgent Care and sent to the Emergency Department via POV . Per Rennis Chris, NP, patient is in need of higher level of care due to chronic cough and SOB. Patient is aware and verbalizes understanding of plan of care.  Vitals:   02/23/23 1039  BP: 125/74  Pulse: 98  Resp: 16  Temp: 97.6 F (36.4 C)  SpO2: 93%

## 2023-02-24 ENCOUNTER — Observation Stay (HOSPITAL_COMMUNITY): Payer: 59

## 2023-02-24 DIAGNOSIS — Z87891 Personal history of nicotine dependence: Secondary | ICD-10-CM | POA: Diagnosis not present

## 2023-02-24 DIAGNOSIS — R6520 Severe sepsis without septic shock: Secondary | ICD-10-CM | POA: Diagnosis present

## 2023-02-24 DIAGNOSIS — Z1152 Encounter for screening for COVID-19: Secondary | ICD-10-CM | POA: Diagnosis not present

## 2023-02-24 DIAGNOSIS — J470 Bronchiectasis with acute lower respiratory infection: Secondary | ICD-10-CM | POA: Diagnosis present

## 2023-02-24 DIAGNOSIS — A419 Sepsis, unspecified organism: Secondary | ICD-10-CM | POA: Diagnosis present

## 2023-02-24 DIAGNOSIS — J471 Bronchiectasis with (acute) exacerbation: Secondary | ICD-10-CM | POA: Diagnosis not present

## 2023-02-24 DIAGNOSIS — I10 Essential (primary) hypertension: Secondary | ICD-10-CM | POA: Diagnosis present

## 2023-02-24 DIAGNOSIS — E871 Hypo-osmolality and hyponatremia: Secondary | ICD-10-CM | POA: Diagnosis present

## 2023-02-24 DIAGNOSIS — E872 Acidosis, unspecified: Secondary | ICD-10-CM | POA: Diagnosis present

## 2023-02-24 DIAGNOSIS — J189 Pneumonia, unspecified organism: Secondary | ICD-10-CM | POA: Diagnosis present

## 2023-02-24 DIAGNOSIS — Z79899 Other long term (current) drug therapy: Secondary | ICD-10-CM | POA: Diagnosis not present

## 2023-02-24 DIAGNOSIS — Z681 Body mass index (BMI) 19 or less, adult: Secondary | ICD-10-CM | POA: Diagnosis not present

## 2023-02-24 DIAGNOSIS — E43 Unspecified severe protein-calorie malnutrition: Secondary | ICD-10-CM | POA: Diagnosis present

## 2023-02-24 DIAGNOSIS — R059 Cough, unspecified: Secondary | ICD-10-CM | POA: Diagnosis present

## 2023-02-24 LAB — COMPREHENSIVE METABOLIC PANEL
ALT: 12 U/L (ref 0–44)
AST: 21 U/L (ref 15–41)
Albumin: 2.4 g/dL — ABNORMAL LOW (ref 3.5–5.0)
Alkaline Phosphatase: 65 U/L (ref 38–126)
Anion gap: 8 (ref 5–15)
BUN: 5 mg/dL — ABNORMAL LOW (ref 8–23)
CO2: 22 mmol/L (ref 22–32)
Calcium: 8.3 mg/dL — ABNORMAL LOW (ref 8.9–10.3)
Chloride: 100 mmol/L (ref 98–111)
Creatinine, Ser: 0.75 mg/dL (ref 0.61–1.24)
GFR, Estimated: >60 mL/min (ref >60)
Glucose, Bld: 173 mg/dL — ABNORMAL HIGH (ref 70–99)
Potassium: 3.9 mmol/L (ref 3.5–5.1)
Sodium: 130 mmol/L — ABNORMAL LOW (ref 135–145)
Total Bilirubin: 1.1 mg/dL (ref 0.3–1.2)
Total Protein: 6.1 g/dL — ABNORMAL LOW (ref 6.5–8.1)

## 2023-02-24 LAB — CBC
HCT: 35.9 % — ABNORMAL LOW (ref 39.0–52.0)
Hemoglobin: 11.8 g/dL — ABNORMAL LOW (ref 13.0–17.0)
MCH: 25.2 pg — ABNORMAL LOW (ref 26.0–34.0)
MCHC: 32.9 g/dL (ref 30.0–36.0)
MCV: 76.5 fL — ABNORMAL LOW (ref 80.0–100.0)
Platelets: 194 10*3/uL (ref 150–400)
RBC: 4.69 MIL/uL (ref 4.22–5.81)
RDW: 12.9 % (ref 11.5–15.5)
WBC: 5.4 10*3/uL (ref 4.0–10.5)
nRBC: 0 % (ref 0.0–0.2)

## 2023-02-24 LAB — PROCALCITONIN: Procalcitonin: 0.38 ng/mL

## 2023-02-24 MED ORDER — SENNA 8.6 MG PO TABS
1.0000 | ORAL_TABLET | Freq: Every day | ORAL | Status: DC | PRN
  Administered 2023-02-24: 8.6 mg via ORAL
  Filled 2023-02-24: qty 1

## 2023-02-24 MED ORDER — FOLIC ACID 1 MG PO TABS
1.0000 mg | ORAL_TABLET | Freq: Every day | ORAL | Status: DC
  Administered 2023-02-24 – 2023-02-25 (×2): 1 mg via ORAL
  Filled 2023-02-24 (×2): qty 1

## 2023-02-24 MED ORDER — DICLOFENAC SODIUM 1 % EX GEL
2.0000 g | Freq: Four times a day (QID) | CUTANEOUS | Status: DC
  Administered 2023-02-24 – 2023-02-25 (×5): 2 g via TOPICAL
  Filled 2023-02-24: qty 100

## 2023-02-24 MED ORDER — ADULT MULTIVITAMIN W/MINERALS CH
1.0000 | ORAL_TABLET | Freq: Every day | ORAL | Status: DC
  Administered 2023-02-24 – 2023-02-25 (×2): 1 via ORAL
  Filled 2023-02-24 (×2): qty 1

## 2023-02-24 MED ORDER — PANTOPRAZOLE SODIUM 40 MG PO TBEC
40.0000 mg | DELAYED_RELEASE_TABLET | Freq: Every day | ORAL | Status: DC
  Administered 2023-02-24 – 2023-02-25 (×2): 40 mg via ORAL
  Filled 2023-02-24 (×2): qty 1

## 2023-02-24 MED ORDER — THIAMINE MONONITRATE 100 MG PO TABS
100.0000 mg | ORAL_TABLET | Freq: Every day | ORAL | Status: DC
  Administered 2023-02-24 – 2023-02-25 (×2): 100 mg via ORAL
  Filled 2023-02-24 (×2): qty 1

## 2023-02-24 MED ORDER — AZITHROMYCIN 500 MG PO TABS
500.0000 mg | ORAL_TABLET | Freq: Every day | ORAL | Status: DC
  Administered 2023-02-24 – 2023-02-25 (×2): 500 mg via ORAL
  Filled 2023-02-24 (×2): qty 1

## 2023-02-24 MED ORDER — IOHEXOL 350 MG/ML SOLN
50.0000 mL | Freq: Once | INTRAVENOUS | Status: AC | PRN
  Administered 2023-02-24: 50 mL via INTRAVENOUS

## 2023-02-24 NOTE — Progress Notes (Signed)
PROGRESS NOTE    Detric Scalisi  ZOX:[id number] DOB: [birth year] DOA: 02/23/2023 PCP: Grayce Sessions, NP    Brief Narrative:  John Luna is a 77 y.o. male with medical history significant of hypertension and pneumonia was brought into the ED due to persistent cough and weight loss  Assessment and Plan: Severe sepsis secondary to community-acquired pneumonia, POA: Patient met criteria for severe sepsis based on tachycardia, tachypnea and lactic acid of 2.5.  Lactic acidosis has improved already and sepsis physiology has also improved already.  He appears comfortable.  Continue Rocephin, Zithromax, check sputum culture, urine antigen for Legionella and streptococci as well as blood culture.   -CT Scan as concern of lung cancer-- cough/weight loss etc  Chest pain: Patient appeared comfortable.   -appears from coughing-- can try voltaren gel.   Essential hypertension:  -resume Cozaar.   Mild acute hyponatremia:  -trending up   Severe protein calorie malnutrition: BMI 18.3.  - Ensure twice daily  -dietitian.   DVT prophylaxis: enoxaparin (LOVENOX) injection 30 mg Start: 02/23/23 1545    Code Status: Full Code  Disposition Plan:  Level of care: Med-Surg Status is: Observation The patient will require care spanning > 2 midnights and should be moved to inpatient   Consultants:  none   Subjective: Pain in chest worse with deep breathing  Objective: Vitals:   02/23/23 2149 02/24/23 0019 02/24/23 0451 02/24/23 0752  BP: 135/66 133/78 128/83 137/69  Pulse: 94 95 95 93  Resp: 16 18 16 16   Temp: 97.9 F (36.6 C) 98 F (36.7 C) 97.9 F (36.6 C) 98.2 F (36.8 C)  TempSrc: Oral Oral Oral   SpO2: 100% 98% 99% 98%  Weight:      Height:        Intake/Output Summary (Last 24 hours) at 02/24/2023 1400 Last data filed at 02/24/2023 0844 Gross per 24 hour  Intake 1474.87 ml  Output --  Net 1474.87 ml   Filed Weights   02/23/23 1218  Weight: 41.3 kg     Examination:   General: Appearance:    Thin male in no acute distress     Lungs:    Diminished, respirations unlabored  Heart:    Normal heart rate.    MS:   All extremities are intact.    Neurologic:   Awake, alert       Data Reviewed: I have personally reviewed following labs and imaging studies  CBC: Recent Labs  Lab 02/23/23 1241 02/23/23 1806 02/24/23 0813  WBC 8.4 7.3 5.4  NEUTROABS 6.8  --   --   HGB 13.2 10.8* 11.8*  HCT 41.1 32.3* 35.9*  MCV 79.3* 76.0* 76.5*  PLT 160 164 194   Basic Metabolic Panel: Recent Labs  Lab 02/23/23 1241 02/23/23 1806 02/24/23 0813  NA 129*  --  130*  K 4.3  --  3.9  CL 98  --  100  CO2 20*  --  22  GLUCOSE 86  --  173*  BUN 7*  --  5*  CREATININE 0.78 0.70 0.75  CALCIUM 8.8*  --  8.3*   GFR: Estimated Creatinine Clearance: 47.3 mL/min (by C-G formula based on SCr of 0.75 mg/dL). Liver Function Tests: Recent Labs  Lab 02/23/23 1241 02/24/23 0813  AST 23 21  ALT 12 12  ALKPHOS 78 65  BILITOT 1.6* 1.1  PROT 7.0 6.1*  ALBUMIN 3.0* 2.4*   No results for input(s): "LIPASE", "AMYLASE" in the last 168 hours. No results  for input(s): "AMMONIA" in the last 168 hours. Coagulation Profile: Recent Labs  Lab 02/23/23 1241  INR 0.9   Cardiac Enzymes: No results for input(s): "CKTOTAL", "CKMB", "CKMBINDEX", "TROPONINI" in the last 168 hours. BNP (last 3 results) No results for input(s): "PROBNP" in the last 8760 hours. HbA1C: No results for input(s): "HGBA1C" in the last 72 hours. CBG: No results for input(s): "GLUCAP" in the last 168 hours. Lipid Profile: No results for input(s): "CHOL", "HDL", "LDLCALC", "TRIG", "CHOLHDL", "LDLDIRECT" in the last 72 hours. Thyroid Function Tests: No results for input(s): "TSH", "T4TOTAL", "FREET4", "T3FREE", "THYROIDAB" in the last 72 hours. Anemia Panel: No results for input(s): "VITAMINB12", "FOLATE", "FERRITIN", "TIBC", "IRON", "RETICCTPCT" in the last 72 hours. Sepsis  Labs: Recent Labs  Lab 02/23/23 1258 02/23/23 1423  LATICACIDVEN 2.5* 0.9    Recent Results (from the past 240 hour(s))  Culture, blood (Routine x 2)     Status: None (Preliminary result)   Collection Time: 02/23/23 12:21 PM   Specimen: BLOOD RIGHT HAND  Result Value Ref Range Status   Specimen Description BLOOD RIGHT HAND  Final   Special Requests   Final    BOTTLES DRAWN AEROBIC AND ANAEROBIC Blood Culture adequate volume   Culture   Final    NO GROWTH < 24 HOURS Performed at [facility], [address] [place], [place] [address]    Report Status PENDING  Incomplete  Culture, blood (Routine x 2)     Status: None (Preliminary result)   Collection Time: 02/23/23 12:26 PM   Specimen: BLOOD LEFT HAND  Result Value Ref Range Status   Specimen Description BLOOD LEFT HAND  Final   Special Requests   Final    BOTTLES DRAWN AEROBIC ONLY Blood Culture adequate volume   Culture   Final    NO GROWTH < 24 HOURS Performed at [facility], [address], [place], [place] [address]    Report Status PENDING  Incomplete  Resp panel by RT-PCR (RSV, Flu A&B, Covid) Anterior Nasal Swab     Status: None   Collection Time: 02/23/23  1:37 PM   Specimen: Anterior Nasal Swab  Result Value Ref Range Status   SARS Coronavirus 2 by RT PCR NEGATIVE NEGATIVE Final   Influenza A by PCR NEGATIVE NEGATIVE Final   Influenza B by PCR NEGATIVE NEGATIVE Final    Comment: (NOTE) The Xpert Xpress SARS-CoV-2/FLU/RSV plus assay is intended as an aid in the diagnosis of influenza from Nasopharyngeal swab specimens and should not be used as a sole basis for treatment. Nasal washings and aspirates are unacceptable for Xpert Xpress SARS-CoV-2/FLU/RSV testing.  Fact Sheet for Patients: BloggerCourse.com  Fact Sheet for Healthcare Providers: SeriousBroker.it  This test is not yet approved or cleared by the [place] FDA and has been  authorized for detection and/or diagnosis of SARS-CoV-2 by FDA under an Emergency Use Authorization (EUA). This EUA will remain in effect (meaning this test can be used) for the duration of the COVID-19 declaration under Section 564(b)(1) of the Act, 21 U.S.C. section 360bbb-3(b)(1), unless the authorization is terminated or revoked.     Resp Syncytial Virus by PCR NEGATIVE NEGATIVE Final    Comment: (NOTE) Fact Sheet for Patients: BloggerCourse.com  Fact Sheet for Healthcare Providers: SeriousBroker.it  This test is not yet approved or cleared by the [place] FDA and has been authorized for detection and/or diagnosis of SARS-CoV-2 by FDA under an Emergency Use Authorization (EUA). This EUA will remain  in effect (meaning this test can be used) for the duration of the COVID-19 declaration under Section 564(b)(1) of the Act, 21 U.S.C. section 360bbb-3(b)(1), unless the authorization is terminated or revoked.  Performed at [facility], [address], [place], [place] [address]          Radiology Studies: DG Chest 2 View  Result Date: 02/23/2023 CLINICAL DATA:  Sepsis. EXAM: CHEST - 2 VIEW COMPARISON:  X-ray 02/23/2023 earlier and older. FINDINGS: Hyperinflation. Normal cardiopericardial silhouette. Right-sided small effusion or pleural thickening with some patchy hazy right midlung opacity. There also some opacity left lung base with what may be a rounded nodular area. A cavitary focus is not excluded. Recommend follow-up. No edema or pneumothorax. IMPRESSION: Hazy right midlung and left lung base opacities. Possible infiltrate. At the left lung base as well as a somewhat rounded opacity which may be cavitary. Recommend further workup with CT when appropriate. Stable right-sided small effusion or pleural thickening. Hyperinflation with chronic changes. Electronically Signed   By: Karen Kays M.D.   On: 02/23/2023 15:24    DG Chest 2 View  Result Date: 02/23/2023 CLINICAL DATA:  cough for a year EXAM: CHEST - 2 VIEW COMPARISON:  CXR 03/24/22 FINDINGS: Small bilateral pleural effusions versus scarring. Redemonstrated calcified pleural plaque along the right lower lung field. Compared to prior exam there is increased hazy opacity at the left lung base that could represent progressive atelectasis or infection. No radiographically apparent displaced rib fractures. Visualized upper abdomen unremarkable. Vertebral body heights are maintained IMPRESSION: Increased hazy opacity at the left lung base could represent progressive atelectasis or infection. Electronically Signed   By: Lorenza Cambridge M.D.   On: 02/23/2023 13:40        Scheduled Meds:  azithromycin  500 mg Oral Daily   dextromethorphan-guaiFENesin  1 tablet Oral BID   enoxaparin (LOVENOX) injection  30 mg Subcutaneous Q24H   feeding supplement  237 mL Oral BID BM   losartan  50 mg Oral Daily   sodium chloride flush  10 mL Intravenous Q12H   Continuous Infusions:  cefTRIAXone (ROCEPHIN)  IV Stopped (02/23/23 1450)     LOS: 0 days    Time spent: 45 minutes spent on chart review, discussion with nursing staff, consultants, updating family and interview/physical exam; more than 50% of that time was spent in counseling and/or coordination of care.    Joseph Art, DO [facility] Available via Epic secure chat 7am-7pm After these hours, please refer to coverage provider listed on [email or web address] 02/24/2023, 2:00 PM

## 2023-02-24 NOTE — Plan of Care (Signed)
Problem: Education: Goal: Knowledge of General Education information will improve Description: Including pain rating scale, medication(s)/side effects and non-pharmacologic comfort measures Outcome: Progressing Pt has some difficulty understanding as to why he was admitted into the hospital d/t the language barrier.  He is of [place] ethnicity and speak [place]).  With the assistance of this RN who is [place] and speak Rh[place] Main), I was able to interpret and inform him as to why he is being hospitalized.  Pt was admitted into the hospital for cough for more than a year which has gotten worse lately with some shortness of breath.  He had further w/u today with a CT of the chest.  Pt has an admitting diagnosis of CAPNA.     Problem: Clinical Measurements: Goal: Will remain free from infection Outcome: Progressing S/Sx of infection monitored and assessed q-shift.  Pt has remained afebrile thus far.  He is on IV abx per MD's orders.    Problem: Clinical Measurements: Goal: Respiratory complications will improve Outcome: Progressing Respiratory status monitored and assessed q-shift.  Pt is on room air with PO2 at 95-99% and respiration rate of 16-18 breaths per minute.  Pt has endorsed some c/o SOB at rest when taking in deep breaths.  Pt came to assess pt and he did fine ambulating in the hallways with being DOE.     Problem: Clinical Measurements: Goal: Cardiovascular complication will be avoided Outcome: Progressing Pt's VS WNL thus far.    Problem: Activity: Goal: Risk for activity intolerance will decrease Outcome: Progressing Pt is independent of all his ADLs.  He is a x1 assist OOB d/t falls risk.    Problem: Nutrition: Goal: Adequate nutrition will be maintained Outcome: Progressing Pt is on a dysphagia diet per MD's orders and can tolerate it w/o s/sx of abdominal pain/ distention or n/v.  He states he does not have much of an appetite and questions why.  Encouraged pt to  take in more PO fluids and oral increase his nutritional status.    Problem: Safety: Goal: Ability to remain free from injury will improve Outcome: Progressing Pt has remained free from falls thus far.  Instructed pt to utilize RN call light for assistance.  Hourly rounds performed.  Bed alarm implemented to keep pt safe from falls.  Settings activated to third most sensitive mode.  Bed in lowest position, locked with two upper side rails engaged.  Belongings and call light within reach.    Problem: Skin Integrity: Goal: Risk for impaired skin integrity will decrease Outcome: Progressing Skin integrity monitored and assessed q-shift.  Instructed pt to turn q2 hours to prevent further skin impairment.  Tubes and drains assessed for device related pressures sores.  Pt is continent of both bowel and bladder.

## 2023-02-24 NOTE — Plan of Care (Signed)

## 2023-02-24 NOTE — Progress Notes (Signed)
Physical Therapy Discharge Patient Details Name: John Luna MRN: [id number] DOB: 02/06/1946 Today's Date: 02/24/2023 Time: 1431-1450 PT Time Calculation (min) (ACUTE ONLY): 19 min  Patient discharged from PT services secondary to goals met and no further PT needs identified.  Please see latest therapy progress note for current level of functioning and progress toward goals.    Progress and discharge plan discussed with patient and/or caregiver: Patient/Caregiver agrees with plan  Marye Round, PT DPT Acute Rehabilitation Services Secure Chat Preferred  Office [phone]   [name] Sheliah Plane 02/24/2023, 5:01 PM

## 2023-02-24 NOTE — Evaluation (Signed)
Physical Therapy Evaluation Patient Details Name: John Luna MRN: [id number] DOB: 06/13/1945 Today's Date: 02/24/2023  History of Present Illness  77 yo male presents to [facility] on 10/31 with cough x1.5 years since pt had PNA at that time, SOB. Workup for sepsis secondary to CAP. PMH includes COPD, HF, HTN.  Clinical Impression   Pt presents with [facility] strength, balance, and activity tolerance vs baseline. Pt ambulated good hallway distance without AD, has mild balance impairments but anticipate this is baseline and pt requires no physical assist during session. Pt maintained SpO2 92% and greater on RA during session. Pt with no further acute or post-acute PT needs, would recommend daily mobility OOB with staff.  PT to sign off.       If plan is discharge home, recommend the following:     Can travel by private vehicle        Equipment Recommendations None recommended by PT  Recommendations for Other Services       Functional Status Assessment Patient has not had a recent decline in their functional status     Precautions / Restrictions Precautions Precautions: Fall Restrictions Weight Bearing Restrictions: No      Mobility  Bed Mobility Overal bed mobility: Needs Assistance Bed Mobility: Supine to Sit, Sit to Supine     Supine to sit: Modified independent (Device/Increase time) Sit to supine: Modified independent (Device/Increase time)   General bed mobility comments: use of HOB elevation and bedrails    Transfers Overall transfer level: Needs assistance Equipment used: None Transfers: Sit to/from Stand Sit to Stand: Supervision           General transfer comment: for safety, stand x2 from EOB and toilet    Ambulation/Gait Ambulation/Gait assistance: Supervision Gait Distance (Feet): 150 Feet Assistive device: None Gait Pattern/deviations: Step-through pattern, Decreased stride length, Drifts right/left Gait velocity: decr     General Gait Details: min  drift L/R with narrow BOS, but no overt LOB or unsteadiness. Pt tolerates min challenge (direction changes, head turns). SpO2 92-93% on RA post-gait  Stairs            Wheelchair Mobility     Tilt Bed    Modified Rankin (Stroke Patients Only)       Balance Overall balance assessment: Mild deficits observed, not formally tested Sitting-balance support: No upper extremity supported, Feet supported Sitting balance-Leahy Scale: Good     Standing balance support: During functional activity, No upper extremity supported Standing balance-Leahy Scale: Fair                               Pertinent Vitals/Pain Pain Assessment Pain Assessment: Faces Faces Pain Scale: No hurt Pain Intervention(s): Monitored during session    Home Living Family/patient expects to be discharged to:: Private residence Living Arrangements: Spouse/significant other Available Help at Discharge: Family;Available 24 hours/day Type of Home: House Home Access: Stairs to enter   [employer] of Steps: 3-4 one entrance, 7 other entrance   Home Layout: One level Home Equipment: None      Prior Function Prior Level of Function : Independent/Modified Independent;Driving             Mobility Comments: pt enjoys walking, drives, endorses no falls       Extremity/Trunk Assessment   Upper Extremity Assessment Upper Extremity Assessment: Defer to OT evaluation    Lower Extremity Assessment Lower Extremity Assessment: Overall WFL for tasks assessed  Cervical / Trunk Assessment Cervical / Trunk Assessment: Normal  Communication   Communication Communication: Hearing impairment Cueing Techniques: Verbal cues;Gestural cues  Cognition Arousal: Alert Behavior During Therapy: WFL for tasks assessed/performed Overall Cognitive Status: Difficult to assess                                 General Comments: smiling and making jokes, pleasant in conversation         General Comments      Exercises     Assessment/Plan    PT Assessment Patient does not need any further PT services  PT Problem List         PT Treatment Interventions      PT Goals (Current goals can be found in the Care Plan section)  Acute Rehab PT Goals Patient Stated Goal: home PT Goal Formulation: With patient Time For Goal Achievement: 02/24/23 Potential to Achieve Goals: Good    Frequency       Co-evaluation               AM-PAC PT "6 Clicks" Mobility  Outcome Measure Help needed turning from your back to your side while in a flat bed without using bedrails?: None Help needed moving from lying on your back to sitting on the side of a flat bed without using bedrails?: None Help needed moving to and from a bed to a chair (including a wheelchair)?: None Help needed standing up from a chair using your arms (e.g., wheelchair or bedside chair)?: None Help needed to walk in hospital room?: A Little Help needed climbing 3-5 steps with a railing? : A Little 6 Click Score: 22    End of Session   Activity Tolerance: Patient tolerated treatment well Patient left: in bed;with call bell/phone within reach;with nursing/sitter in room;with family/visitor present Nurse Communication: Mobility status PT Visit Diagnosis: Other abnormalities of gait and mobility (R26.89)    Time: 1431-1450 PT Time Calculation (min) (ACUTE ONLY): 19 min   Charges:   PT Evaluation $PT Eval Low Complexity: 1 Low   PT General Charges $$ ACUTE PT VISIT: 1 Visit         Marye Round, PT DPT Acute Rehabilitation Services Secure Chat Preferred  Office [phone]   John Luna 02/24/2023, 4:59 PM

## 2023-02-24 NOTE — Progress Notes (Addendum)
Transition of Care [facility]) - Inpatient Brief Assessment   Patient Details  Name: John Luna MRN: [id number] Date of Birth: [birth year]  Transition of Care [facility]) CM/SW Contact:    Janae Bridgeman, RN Phone Number: 02/24/2023, 1:50 PM   Clinical Narrative: CM met with the patient at the bedside and Austin Miles, RN was present in the patient's room to interpret.  Unable to utilize Interpreter I pad.  Patient admitted for pneumonia and is currently on RA.  Austin Miles, RN states that staff can contact her over the weekend with assistance with interpretation via phone if needed - [phone].  Patient lives with the spouse at the home and will return to the home when medically stable for discharge.  Patient with history or ETOH and has not drank any beer/ alcohol for the past 2 weeks.  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  Resources will be provided in the AVS.  Family provides transportation to appointments in the community.  No DME at the home.  The patient was provided with [facility] letter and signed document was left with the patient at the bedside.  No TOC needs at this time.   Transition of Care Asessment: Insurance and Status: (P) Insurance coverage has been reviewed Patient has primary care physician: (P) Yes Home environment has been reviewed: (P) from home with spouse Prior level of function:: (P) Independent Prior/Current Home Services: (P) No current home services Social Determinants of Health Reivew: (P) SDOH reviewed interventions complete Readmission risk has been reviewed: (P) Yes Transition of care needs: (P) transition of care needs identified, TOC will continue to follow

## 2023-02-24 NOTE — Care Management Obs Status (Cosign Needed)
MEDICARE OBSERVATION STATUS NOTIFICATION   Patient Details  Name: John Luna MRN: [id number] Date of Birth: 07/26/1945   Medicare Observation Status Notification Given:  Yes    Janae Bridgeman, RN 02/24/2023, 12:03 PM

## 2023-02-24 NOTE — Progress Notes (Addendum)
Initial Nutrition Assessment  DOCUMENTATION CODES:   Severe malnutrition in context of social or environmental circumstances  INTERVENTION:   -Recommend to liberalize diet to dysphagia III for ease of chewing and in effort to improve PO intake.  -Provide thiamine 100 mg daily, folic acid 1 gm daily, MVI/Minerals-1 Tab daily for micronutrient support due to hx heavy alcohol use.  Consider high dose thiamine protocol.  -Monitor magnesium, potassium, and phosphorus BID for at least 3 days, MD to replete as needed, as pt is at risk for refeeding syndrome given prolonged inadequate nutritional intakes, hx reported heavy alcohol use.   -Continue Ensure Enlive po BID, each supplement provides 350 kcal and 20 grams of protein.  -Recommend hgb A1C draw.    NUTRITION DIAGNOSIS:   Severe Malnutrition related to inability to eat, decreased appetite, social / environmental circumstances, acute illness as evidenced by moderate fat depletion, severe fat depletion, severe muscle depletion, moderate muscle depletion, per patient/family report, energy intake < or equal to 50% for > or equal to 1 month.    GOAL:   Patient will meet greater than or equal to 90% of their needs    MONITOR:   PO intake, Supplement acceptance, Labs, Weight trends, Skin, I & O's  REASON FOR ASSESSMENT:   Consult Assessment of nutrition requirement/status  ASSESSMENT:  77 y/o male brought into the ED due to persistent cough. He has been having a cough for more than a year and has worsened lately with some shortness of breath, reported chest pain. Severe sepsis secondary to community-acquired pneumonia.     Admitted with community acquired pneumonia.  PMH: HTN, pneumonia.     Per review of EMR, stable weights noted past 11 years.  Language interpretor to translate John Luna provided by RN staff whom is also family. Patient and family inform he has been without an appetite for approximately the past year.  Patient expresses frustration of not knowing the reason he has not been hungry.  States he feels a lack of desire to eat and very tired to eat.  He has been eating well less than half of usual intakes. Per family report, patient has a hx of heavy drinking usually beer, could consume one case in a day.  He quit drinking completely two weeks ago and was not consuming as much the past year. Poor dentition noted, needs softer foods. Denies difficulty swallowing or coughing with thin liquids. Denies food allergies. He has been offered an Ensure which he has not consumed yet.   Medications reviewed and include azithromycin, rocephin, bowel regimen.  Labs: glucose 173, no recent hgb A1C lab  NUTRITION - FOCUSED PHYSICAL EXAM:  Flowsheet Row Most Recent Value  Orbital Region Moderate depletion  Upper Arm Region Severe depletion  Thoracic and Lumbar Region Severe depletion  Buccal Region Moderate depletion  Temple Region Moderate depletion  Clavicle Bone Region Severe depletion  Clavicle and Acromion Bone Region Severe depletion  Scapular Bone Region Moderate depletion  Dorsal Hand Moderate depletion  Patellar Region Moderate depletion  Anterior Thigh Region Moderate depletion  Posterior Calf Region Moderate depletion  Edema (RD Assessment) None  Hair Reviewed  Eyes Reviewed  Mouth Reviewed  Skin Reviewed  Nails Reviewed       Diet Order:   Diet Order             Diet Heart Room service appropriate? Yes; Fluid consistency: Thin  Diet effective now  EDUCATION NEEDS:   Not appropriate for education at this time  Skin:  Skin Assessment: Reviewed RN Assessment  Last BM:  prior to admission  Height:   Ht Readings from Last 1 Encounters:  02/23/23 4\' 11"  (1.499 m)    Weight:   Wt Readings from Last 1 Encounters:  02/23/23 41.3 kg    Ideal Body Weight:  44.8 kg  BMI:  Body mass index is 18.39 kg/m.  Estimated Nutritional Needs:   Kcal:   1450-1650 kcal/day  Protein:  58-67 g/day  Fluid:  1650 mL/day    John Luna, RDLD Clinical Dietitian See AMION for contact information

## 2023-02-25 ENCOUNTER — Other Ambulatory Visit (HOSPITAL_COMMUNITY): Payer: Self-pay

## 2023-02-25 DIAGNOSIS — J471 Bronchiectasis with (acute) exacerbation: Secondary | ICD-10-CM

## 2023-02-25 LAB — EXPECTORATED SPUTUM ASSESSMENT W GRAM STAIN, RFLX TO RESP C

## 2023-02-25 LAB — CBC
HCT: 32.4 % — ABNORMAL LOW (ref 39.0–52.0)
Hemoglobin: 10.8 g/dL — ABNORMAL LOW (ref 13.0–17.0)
MCH: 24.9 pg — ABNORMAL LOW (ref 26.0–34.0)
MCHC: 33.3 g/dL (ref 30.0–36.0)
MCV: 74.8 fL — ABNORMAL LOW (ref 80.0–100.0)
Platelets: 188 10*3/uL (ref 150–400)
RBC: 4.33 MIL/uL (ref 4.22–5.81)
RDW: 12.8 % (ref 11.5–15.5)
WBC: 5 10*3/uL (ref 4.0–10.5)
nRBC: 0 % (ref 0.0–0.2)

## 2023-02-25 LAB — HEMOGLOBIN A1C
Hgb A1c MFr Bld: 5.6 % (ref 4.8–5.6)
Mean Plasma Glucose: 114.02 mg/dL

## 2023-02-25 LAB — BASIC METABOLIC PANEL
Anion gap: 10 (ref 5–15)
BUN: 5 mg/dL — ABNORMAL LOW (ref 8–23)
CO2: 22 mmol/L (ref 22–32)
Calcium: 8.3 mg/dL — ABNORMAL LOW (ref 8.9–10.3)
Chloride: 96 mmol/L — ABNORMAL LOW (ref 98–111)
Creatinine, Ser: 0.6 mg/dL — ABNORMAL LOW (ref 0.61–1.24)
GFR, Estimated: >60 mL/min (ref >60)
Glucose, Bld: 100 mg/dL — ABNORMAL HIGH (ref 70–99)
Potassium: 3.9 mmol/L (ref 3.5–5.1)
Sodium: 128 mmol/L — ABNORMAL LOW (ref 135–145)

## 2023-02-25 MED ORDER — DOXYCYCLINE HYCLATE 100 MG PO TABS
100.0000 mg | ORAL_TABLET | Freq: Two times a day (BID) | ORAL | Status: DC
  Administered 2023-02-25: 100 mg via ORAL
  Filled 2023-02-25: qty 1

## 2023-02-25 MED ORDER — DICLOFENAC SODIUM 1 % EX GEL
2.0000 g | Freq: Four times a day (QID) | CUTANEOUS | 0 refills | Status: DC
  Filled 2023-02-25: qty 100, 25d supply, fill #0

## 2023-02-25 MED ORDER — SODIUM CHLORIDE 3 % IN NEBU
4.0000 mL | INHALATION_SOLUTION | Freq: Two times a day (BID) | RESPIRATORY_TRACT | 0 refills | Status: DC
  Filled 2023-02-25: qty 240, 30d supply, fill #0

## 2023-02-25 MED ORDER — ALBUTEROL SULFATE (2.5 MG/3ML) 0.083% IN NEBU
2.5000 mg | INHALATION_SOLUTION | RESPIRATORY_TRACT | 1 refills | Status: AC | PRN
  Filled 2023-02-25: qty 90, 5d supply, fill #0

## 2023-02-25 MED ORDER — DM-GUAIFENESIN ER 30-600 MG PO TB12: 1.0000 | ORAL_TABLET | Freq: Two times a day (BID) | ORAL | Status: DC | PRN

## 2023-02-25 MED ORDER — SODIUM CHLORIDE 3 % IN NEBU: 4.0000 mL | INHALATION_SOLUTION | Freq: Two times a day (BID) | RESPIRATORY_TRACT | 0 refills | Status: DC

## 2023-02-25 MED ORDER — LIDOCAINE 5 % EX PTCH
1.0000 | MEDICATED_PATCH | Freq: Every day | CUTANEOUS | Status: DC
  Administered 2023-02-25: 1 via TRANSDERMAL
  Filled 2023-02-25: qty 1

## 2023-02-25 MED ORDER — ALBUTEROL SULFATE (2.5 MG/3ML) 0.083% IN NEBU: 2.5000 mg | INHALATION_SOLUTION | RESPIRATORY_TRACT | Status: DC | PRN

## 2023-02-25 MED ORDER — SODIUM CHLORIDE 3 % IN NEBU
4.0000 mL | INHALATION_SOLUTION | Freq: Two times a day (BID) | RESPIRATORY_TRACT | Status: DC
  Filled 2023-02-25 (×2): qty 4

## 2023-02-25 MED ORDER — LIDOCAINE 5 % EX PTCH
1.0000 | MEDICATED_PATCH | Freq: Every day | CUTANEOUS | 0 refills | Status: DC
  Filled 2023-02-25: qty 30, 30d supply, fill #0

## 2023-02-25 MED ORDER — AZITHROMYCIN 500 MG PO TABS
500.0000 mg | ORAL_TABLET | Freq: Every day | ORAL | 0 refills | Status: DC
  Filled 2023-02-25: qty 5, 5d supply, fill #0

## 2023-02-25 MED ORDER — DOXYCYCLINE HYCLATE 100 MG PO TABS
100.0000 mg | ORAL_TABLET | Freq: Two times a day (BID) | ORAL | 0 refills | Status: DC
  Filled 2023-02-25: qty 14, 7d supply, fill #0

## 2023-02-25 MED ORDER — PANTOPRAZOLE SODIUM 40 MG PO TBEC
40.0000 mg | DELAYED_RELEASE_TABLET | Freq: Every day | ORAL | 0 refills | Status: DC
  Filled 2023-02-25: qty 30, 30d supply, fill #0

## 2023-02-25 NOTE — Discharge Instructions (Signed)
Use your flutter valve

## 2023-02-25 NOTE — TOC Transition Note (Signed)
Transition of Care [facility]) - CM/SW Discharge Note   Patient Details  Name: John Luna MRN: [id number] Date of Birth: 29-Jan-1946  [facility] CM/SW Contact:  Lawerance Sabal, RN Phone Number: 02/25/2023, 10:15 AM   Clinical Narrative:     Nebulizer will be delivered to the patient's room   Final next level of care: Home/Self Care Barriers to Discharge: No Barriers Identified   Patient Goals and CMS Choice      Discharge Placement                         Discharge Plan and Services Additional resources added to the After Visit Summary for                  DME Arranged: Nebulizer machine DME Agency: [employer] Date DME Agency Contacted: 02/25/23 Time DME Agency Contacted: 1015 Representative spoke with at DME Agency: Vaughan Basta            Social Determinants of Health (SDOH) Interventions SDOH Screenings   Food Insecurity: No Food Insecurity (02/23/2023)  Housing: Low Risk  (02/23/2023)  Transportation Needs: No Transportation Needs (02/23/2023)  Utilities: Not At Risk (02/23/2023)  Depression (PHQ2-9): Low Risk  (01/25/2021)  Tobacco Use: Medium Risk (02/23/2023)     Readmission Risk Interventions     No data to display

## 2023-02-25 NOTE — Discharge Summary (Addendum)
Physician Discharge Summary  John Luna WUJ:[id number] DOB: Apr 21, 1946 DOA: 02/23/2023  PCP: Grayce Sessions, NP  Admit date: 02/23/2023 Discharge date: 02/25/2023  Admitted From: home Discharge disposition: home   Recommendations for Outpatient Follow-Up:   Close pulm follow up- referral placed for bronchiectasis BMP 1 week DME: nebulizer Follow sputum specimen   Discharge Diagnosis:   Principal Problem:   CAP (community acquired pneumonia) Active Problems:   Protein-calorie malnutrition, severe   Hyponatremia   Hypertension   Severe sepsis (HCC)   Cough    Discharge Condition: Improved.  Diet recommendation: DYs 3  Wound care: None.  Code status: Full.   History of Present Illness:   John Luna is a 77 y.o. male with medical history significant of hypertension and pneumonia was brought into the ED due to persistent cough.  Patient speaks [place], unfortunately we do not have interpreter for that.  I initially called his son John Luna and left a voicemail.  I then called his grandson John Luna and left a voicemail to him.  When I saw him in the emergency department, there was no family member present at the bedside.  According to ED physician, patient has been having cough for more than a year which has gotten worse lately with some shortness of breath.  No reports of fever or any other complaint.  Later on, I received a call back from John Luna who mentioned that patient actually has been having complaints of chest pain lately and he has been describing it as sharp and intermittent, not really related to his cough.  He is not bringing up any phlegm.  He confirmed rest of the history.  Although he did not seem to be much familiar with patient's past medical history such as hypertension.  By the time I saw patient, he appeared very comfortable.    Hospital Course by Problem:   Severe sepsis secondary to community-acquired pneumonia, POA/bronchiectasis:  Patient met criteria for severe sepsis based on tachycardia, tachypnea and lactic acid of 2.5.  Lactic acidosis has improved already and sepsis physiology has also improved already.  He appears comfortable -not on O2 -will treat with abx -will start hypertonic nebs and albuterol -continue flutter valve  -close pulm follow up   Chest pain: Patient appeared comfortable.   -appears from coughing-- can try voltaren gel/lidocaine patch   Essential hypertension:  -resume Cozaar.   Mild hyponatremia:  -follow outpatient    Severe protein calorie malnutrition: BMI 18.3.  - Ensure twice daily  -dietitian.    Medical Consultants:   Pulm (phone)   Discharge Exam:   Vitals:   02/25/23 0620 02/25/23 0800  BP: 124/71 119/67  Pulse: 87 93  Resp: 18 18  Temp: 98.8 F (37.1 C) 98.4 F (36.9 C)  SpO2: 98% 97%   Vitals:   02/24/23 1556 02/24/23 2041 02/25/23 0620 02/25/23 0800  BP: 129/69 (!) 145/72 124/71 119/67  Pulse: 91 (!) 104 87 93  Resp: 16 18 18 18   Temp: 98.9 F (37.2 C) 99.2 F (37.3 C) 98.8 F (37.1 C) 98.4 F (36.9 C)  TempSrc: Oral Oral Oral Oral  SpO2: 95% 98% 98% 97%  Weight:      Height:        General exam: Appears calm and comfortable.    The results of significant diagnostics from this hospitalization (including imaging, microbiology, ancillary and laboratory) are listed below for reference.     Procedures and Diagnostic Studies:   CT CHEST W  CONTRAST  Result Date: 02/24/2023 CLINICAL DATA:  Chronic and persisting cough for greater than 8 weeks. Weight loss. Smoker. EXAM: CT CHEST WITH CONTRAST TECHNIQUE: Multidetector CT imaging of the chest was performed during intravenous contrast administration. RADIATION DOSE REDUCTION: This exam was performed according to the departmental dose-optimization program which includes automated exposure control, adjustment of the mA and/or kV according to patient size and/or use of iterative reconstruction technique.  CONTRAST:  50mL OMNIPAQUE IOHEXOL 350 MG/ML SOLN COMPARISON:  12/20/2020 FINDINGS: Cardiovascular: Mild cardiac enlargement. Minimal pericardial effusion or thickening. Normal caliber thoracic aorta. Calcification in the aorta and coronary arteries. Mediastinum/Nodes: Esophagus is decompressed but appears to have diffusely thickened wall, possibly indicating reflux disease or esophagitis. No focal lesion is identified. Moderately prominent mediastinal lymph nodes with representative pretracheal nodes measuring up to 8 mm diameter. Appearance is similar to prior study, most likely reactive. Thyroid gland is unremarkable. Lungs/Pleura: Severe emphysematous changes in the lungs with bullous disease in the right apex. Multiple areas of linear scarring throughout both lungs. Cystic and cylindrical bronchiectasis with air-fluid levels in the cystic bronchiectatic spaces particularly in the left base. Prominent pleural thickening and pleural calcification on the right. This is likely postinfectious or inflammatory although asbestos related disease could have this appearance. Calcified granulomas in the lungs. These findings are unchanged since prior study. There is airspace and interstitial consolidation demonstrated in the left lung base, progressing since prior study and in the right middle lung, improving since prior study. This may represent waxing and waning infiltrates or possibly areas of new and resolving active inflammation. Focal areas of nodular scarring in both lungs are unchanged since the prior study. No developing pulmonary nodules are seen. Bronchial wall thickening with secretions or mucous plugging in the lower lobe bronchi. Upper Abdomen: No acute abnormalities. Musculoskeletal: Degenerative changes in the spine. No focal bone lesions. IMPRESSION: 1. Chronic emphysematous changes and scarring in the lungs. 2. Severe chronic cystic and cylindrical bronchiectasis with bronchial wall thickening and mucous  plugging versus secretions. 3. Increasing interstitial and alveolar infiltrates in the left lower lung with persistent but decreasing interstitial and alveolar infiltrates in the right middle lung. This could represent waxing and waning disease versus superimposed pneumonia. Aspiration would also be a possibility. 4. Nodular scarring in the lungs is unchanged since prior study. 5. Right pleural thickening and calcification. Asymmetric appearance suggests most likely infectious or inflammatory etiology. Asbestos exposure would be a less likely consideration. No change since prior study. 6. Diffuse esophageal wall thickening without distention, likely reflux disease or esophagitis. 7. Mild cardiac enlargement. Minimal pericardial effusion or thickening. 8. Aortic atherosclerosis. Electronically Signed   By: Burman Nieves M.D.   On: 02/24/2023 15:41   DG Chest 2 View  Result Date: 02/23/2023 CLINICAL DATA:  Sepsis. EXAM: CHEST - 2 VIEW COMPARISON:  X-ray 02/23/2023 earlier and older. FINDINGS: Hyperinflation. Normal cardiopericardial silhouette. Right-sided small effusion or pleural thickening with some patchy hazy right midlung opacity. There also some opacity left lung base with what may be a rounded nodular area. A cavitary focus is not excluded. Recommend follow-up. No edema or pneumothorax. IMPRESSION: Hazy right midlung and left lung base opacities. Possible infiltrate. At the left lung base as well as a somewhat rounded opacity which may be cavitary. Recommend further workup with CT when appropriate. Stable right-sided small effusion or pleural thickening. Hyperinflation with chronic changes. Electronically Signed   By: Karen Kays M.D.   On: 02/23/2023 15:24   DG Chest 2 View  Result Date: 02/23/2023 CLINICAL DATA:  cough for a year EXAM: CHEST - 2 VIEW COMPARISON:  CXR 03/24/22 FINDINGS: Small bilateral pleural effusions versus scarring. Redemonstrated calcified pleural plaque along the right  lower lung field. Compared to prior exam there is increased hazy opacity at the left lung base that could represent progressive atelectasis or infection. No radiographically apparent displaced rib fractures. Visualized upper abdomen unremarkable. Vertebral body heights are maintained IMPRESSION: Increased hazy opacity at the left lung base could represent progressive atelectasis or infection. Electronically Signed   By: Lorenza Cambridge M.D.   On: 02/23/2023 13:40     Labs:   Basic Metabolic Panel: Recent Labs  Lab 02/23/23 1241 02/23/23 1806 02/24/23 0813 02/25/23 0415  NA 129*  --  130* 128*  K 4.3  --  3.9 3.9  CL 98  --  100 96*  CO2 20*  --  22 22  GLUCOSE 86  --  173* 100*  BUN 7*  --  5* 5*  CREATININE 0.78 0.70 0.75 0.60*  CALCIUM 8.8*  --  8.3* 8.3*   GFR Estimated Creatinine Clearance: 47.3 mL/min (A) (by C-G formula based on SCr of 0.6 mg/dL (L)). Liver Function Tests: Recent Labs  Lab 02/23/23 1241 02/24/23 0813  AST 23 21  ALT 12 12  ALKPHOS 78 65  BILITOT 1.6* 1.1  PROT 7.0 6.1*  ALBUMIN 3.0* 2.4*   No results for input(s): "LIPASE", "AMYLASE" in the last 168 hours. No results for input(s): "AMMONIA" in the last 168 hours. Coagulation profile Recent Labs  Lab 02/23/23 1241  INR 0.9    CBC: Recent Labs  Lab 02/23/23 1241 02/23/23 1806 02/24/23 0813 02/25/23 0415  WBC 8.4 7.3 5.4 5.0  NEUTROABS 6.8  --   --   --   HGB 13.2 10.8* 11.8* 10.8*  HCT 41.1 32.3* 35.9* 32.4*  MCV 79.3* 76.0* 76.5* 74.8*  PLT 160 164 194 188   Cardiac Enzymes: No results for input(s): "CKTOTAL", "CKMB", "CKMBINDEX", "TROPONINI" in the last 168 hours. BNP: Invalid input(s): "POCBNP" CBG: No results for input(s): "GLUCAP" in the last 168 hours. D-Dimer Recent Labs    02/23/23 1806  DDIMER 0.61*   Hgb A1c Recent Labs    02/25/23 0415  HGBA1C 5.6   Lipid Profile No results for input(s): "CHOL", "HDL", "LDLCALC", "TRIG", "CHOLHDL", "LDLDIRECT" in the last 72  hours. Thyroid function studies No results for input(s): "TSH", "T4TOTAL", "T3FREE", "THYROIDAB" in the last 72 hours.  Invalid input(s): "FREET3" Anemia work up No results for input(s): "VITAMINB12", "FOLATE", "FERRITIN", "TIBC", "IRON", "RETICCTPCT" in the last 72 hours. Microbiology Recent Results (from the past 240 hour(s))  Culture, blood (Routine x 2)     Status: None (Preliminary result)   Collection Time: 02/23/23 12:21 PM   Specimen: BLOOD RIGHT HAND  Result Value Ref Range Status   Specimen Description BLOOD RIGHT HAND  Final   Special Requests   Final    BOTTLES DRAWN AEROBIC AND ANAEROBIC Blood Culture adequate volume   Culture   Final    NO GROWTH 2 DAYS Performed at [facility], [address], [place], [place] [address]    Report Status PENDING  Incomplete  Culture, blood (Routine x 2)     Status: None (Preliminary result)   Collection Time: 02/23/23 12:26 PM   Specimen: BLOOD LEFT HAND  Result Value Ref Range Status   Specimen Description BLOOD LEFT HAND  Final   Special Requests   Final  BOTTLES DRAWN AEROBIC ONLY Blood Culture adequate volume   Culture   Final    NO GROWTH 2 DAYS Performed at [facility], [address] [place], [place] [address]    Report Status PENDING  Incomplete  Resp panel by RT-PCR (RSV, Flu A&B, Covid) Anterior Nasal Swab     Status: None   Collection Time: 02/23/23  1:37 PM   Specimen: Anterior Nasal Swab  Result Value Ref Range Status   SARS Coronavirus 2 by RT PCR NEGATIVE NEGATIVE Final   Influenza A by PCR NEGATIVE NEGATIVE Final   Influenza B by PCR NEGATIVE NEGATIVE Final    Comment: (NOTE) The Xpert Xpress SARS-CoV-2/FLU/RSV plus assay is intended as an aid in the diagnosis of influenza from Nasopharyngeal swab specimens and should not be used as a sole basis for treatment. Nasal washings and aspirates are unacceptable for Xpert Xpress SARS-CoV-2/FLU/RSV testing.  Fact Sheet for  Patients: BloggerCourse.com  Fact Sheet for Healthcare Providers: SeriousBroker.it  This test is not yet approved or cleared by the [place] FDA and has been authorized for detection and/or diagnosis of SARS-CoV-2 by FDA under an Emergency Use Authorization (EUA). This EUA will remain in effect (meaning this test can be used) for the duration of the COVID-19 declaration under Section 564(b)(1) of the Act, 21 U.S.C. section 360bbb-3(b)(1), unless the authorization is terminated or revoked.     Resp Syncytial Virus by PCR NEGATIVE NEGATIVE Final    Comment: (NOTE) Fact Sheet for Patients: BloggerCourse.com  Fact Sheet for Healthcare Providers: SeriousBroker.it  This test is not yet approved or cleared by the [place] FDA and has been authorized for detection and/or diagnosis of SARS-CoV-2 by FDA under an Emergency Use Authorization (EUA). This EUA will remain in effect (meaning this test can be used) for the duration of the COVID-19 declaration under Section 564(b)(1) of the Act, 21 U.S.C. section 360bbb-3(b)(1), unless the authorization is terminated or revoked.  Performed at [facility], 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   albuterol (2.5 MG/3ML) 0.083% nebulizer solution Commonly known as: PROVENTIL Take 3 mLs (2.5 mg total) by nebulization every 4 (four) hours as needed for wheezing or shortness of breath.   azithromycin 500 MG tablet Commonly known as: ZITHROMAX Take 1 tablet (500 mg total) by mouth daily. Start taking on: February 26, 2023   dextromethorphan-guaiFENesin 30-600 MG 12hr tablet Commonly known as: MUCINEX DM Take 1 tablet by mouth 2 (two) times daily as needed for cough.   diclofenac Sodium 1 % Gel Commonly known as: VOLTAREN Apply 2 g topically 4 (four) times daily.   doxycycline 100  MG tablet Commonly known as: VIBRA-TABS Take 1 tablet (100 mg total) by mouth every 12 (twelve) hours.   ibuprofen 600 MG tablet Commonly known as: ADVIL Take 600 mg by mouth every 6 (six) hours as needed for moderate pain (pain score 4-6) or fever.   lidocaine 5 % Commonly known as: LIDODERM Place 1 patch onto the skin daily. Remove & Discard patch within 12 hours or as directed by MD   losartan 50 MG tablet Commonly known as: COZAAR TAKE 1 TABLET(50 MG) BY MOUTH DAILY   pantoprazole 40 MG tablet Commonly known as: PROTONIX Take 1 tablet (40 mg total) by mouth daily. Start taking on: February 26, 2023   sodium chloride HYPERTONIC 3 % nebulizer solution Take 4 mLs by nebulization 2 (two) times daily.               Durable Medical Equipment  (From admission, onward)           Start     Ordered   02/25/23 1006  For home use only DME Nebulizer machine  Once       Question Answer Comment  Patient needs a nebulizer to treat with the following condition Bronchiectasis (HCC)   Length of Need Lifetime      02/25/23 1007            Follow-up Information     Grayce Sessions, NP Follow up.   Specialty: Internal Medicine Contact information: [address] [place] [address] [phone]                  Time  coordinating discharge: 45 min  Signed:  Joseph Art DO  [facility] 02/25/2023, 10:18 AM

## 2023-02-25 NOTE — Progress Notes (Signed)
Sputum specimen sent to lab

## 2023-02-27 ENCOUNTER — Telehealth: Payer: Self-pay

## 2023-02-27 NOTE — Transitions of Care (Post Inpatient/ED Visit) (Signed)
   02/27/2023  Name: John Luna MRN: [id number] DOB: February 22, 1946  Today's TOC FU Call Status: Today's TOC FU Call Status:: Unsuccessful Call (1st Attempt) Unsuccessful Call (1st Attempt) Date: 02/27/23  Attempted to reach the patient regarding the most recent Inpatient/ED visit.  Follow Up Plan: Additional outreach attempts will be made to reach the patient to complete the Transitions of Care (Post Inpatient/ED visit) call.   Signature Robyne Peers, RN

## 2023-02-28 ENCOUNTER — Telehealth: Payer: Self-pay

## 2023-02-28 LAB — CULTURE, BLOOD (ROUTINE X 2)
Culture: NO GROWTH
Culture: NO GROWTH
Special Requests: ADEQUATE
Special Requests: ADEQUATE

## 2023-02-28 LAB — CULTURE, RESPIRATORY W GRAM STAIN: Culture: NORMAL

## 2023-02-28 NOTE — Transitions of Care (Post Inpatient/ED Visit) (Signed)
   02/28/2023  Name: John Luna MRN: [id number] DOB: 09/21/1945  Today's TOC FU Call Status: Today's TOC FU Call Status:: Successful TOC FU Call Completed Unsuccessful Call (1st Attempt) Date: 02/27/23 [facility] FU Call Complete Date: 02/28/23 Patient's Name and Date of Birth confirmed.  Transition Care Management Follow-up Telephone Call Date of Discharge: 02/25/23 Discharge Facility: [facility]) Type of Discharge: Inpatient Admission Primary Inpatient Discharge Diagnosis:: CAP How have you been since you were released from the hospital?: Same (The patient stated that he still feels the " same.") Any questions or concerns?: No  Items Reviewed: Did you receive and understand the discharge instructions provided?: Yes (They have the discharge instructions but neither the patient or his wife read [place].) Medications obtained,verified, and reconciled?: Partial Review Completed Reason for Partial Mediation Review: The patient's wife, who manages his medications, does not  read [place]. She explained that the doctor at the hospital reviewed all the medications with them and marked the bottles  when he is to take them: am, pm.  His wife said he finished one antibiotic and he has the medications for his BP and stomach and will have enough pills until he follows up with PCP. She was not able to read the letters on the bottles, she again said that the doctor marked all of the bottles for  them.  They have a nebulizer and she correctly stated how she opens the ampules of medications and puts the medication in the cup for the breathing treatments and plugs in/ turns on the machine.  I instructed her to bring all of his medications to his appointment with Ms Randa Evens, next week and she said she would have everything organized by then. Any new allergies since your discharge?: No Dietary orders reviewed?: No Do you have support at home?: Yes People in Home: spouse Name of Support/Comfort Primary Source:  his wife, Leigh Aurora  Medications Reviewed Today: Medications Reviewed Today   Medications were not reviewed in this encounter     Home Care and Equipment/Supplies: Were Home Health Services Ordered?: No Any new equipment or medical supplies ordered?: Yes Name of Medical supply agency?: [employer]- nebulizer Were you able to get the equipment/medical supplies?: Yes Do you have any questions related to the use of the equipment/supplies?: No  Functional Questionnaire: Do you need assistance with bathing/showering or dressing?: No Do you need assistance with meal preparation?: Yes (his wife prepares meals) Do you need assistance with eating?: No Do you have difficulty maintaining continence: No Do you need assistance with getting out of bed/getting out of a chair/moving?: No Do you have difficulty managing or taking your medications?: Yes (his wife manages his medications)  Follow up appointments reviewed: PCP Follow-up appointment confirmed?: Yes Date of PCP follow-up appointment?: 03/07/23 (the appointment information was text to the patient's wife as she requested.) Follow-up Provider: Gwinda Passe, NP Specialist Hospital Follow-up appointment confirmed?: No Reason Specialist Follow-Up Not Confirmed:  (referral was just made to pulmonary at the time of discharge.  They may need assistance with scheduling this appt.) Do you need transportation to your follow-up appointment?: No (His wife confirmed that he will have transportation to his appointment at [facility] next week.) Do you understand care options if your condition(s) worsen?: Yes-patient verbalized understanding    SIGNATURE  Robyne Peers, RN

## 2023-03-06 ENCOUNTER — Telehealth (INDEPENDENT_AMBULATORY_CARE_PROVIDER_SITE_OTHER): Payer: Self-pay | Admitting: Primary Care

## 2023-03-06 NOTE — Telephone Encounter (Signed)
Pt did not answer and needs an interpreter

## 2023-03-07 ENCOUNTER — Encounter (INDEPENDENT_AMBULATORY_CARE_PROVIDER_SITE_OTHER): Payer: Self-pay | Admitting: Primary Care

## 2023-03-07 ENCOUNTER — Ambulatory Visit (INDEPENDENT_AMBULATORY_CARE_PROVIDER_SITE_OTHER): Payer: 59 | Admitting: Primary Care

## 2023-03-07 VITALS — BP 137/75 | HR 84 | Resp 16 | Ht 61.5 in | Wt 91.4 lb

## 2023-03-07 DIAGNOSIS — F1010 Alcohol abuse, uncomplicated: Secondary | ICD-10-CM

## 2023-03-07 DIAGNOSIS — Z09 Encounter for follow-up examination after completed treatment for conditions other than malignant neoplasm: Secondary | ICD-10-CM

## 2023-03-07 DIAGNOSIS — Z720 Tobacco use: Secondary | ICD-10-CM

## 2023-03-07 DIAGNOSIS — Z7689 Persons encountering health services in other specified circumstances: Secondary | ICD-10-CM

## 2023-03-07 DIAGNOSIS — J189 Pneumonia, unspecified organism: Secondary | ICD-10-CM | POA: Diagnosis not present

## 2023-03-07 DIAGNOSIS — F17210 Nicotine dependence, cigarettes, uncomplicated: Secondary | ICD-10-CM | POA: Diagnosis not present

## 2023-03-07 NOTE — Patient Instructions (Addendum)
Community-Acquired Pneumonia, Adult Pneumonia is an infection of the lungs. It causes irritation and swelling in the airways of the lungs. Mucus and fluid may also build up inside the airways. This may cause coughing and trouble breathing. One type of pneumonia can happen while you are in a hospital. A different type can happen when you are not in a hospital (community-acquired pneumonia). What are the causes?  This condition is caused by germs (viruses, bacteria, or fungi). Some types of germs can spread from person to person. Pneumonia is not thought to spread from person to person. What increases the risk? You have a long-term (chronic) disease, such as: Disease of the lungs. This may be chronic obstructive pulmonary disease (COPD) or asthma. Heart failure. Cystic fibrosis. Diabetes. Kidney disease. Sickle cell disease. HIV. You have other health problems, such as: Your body's defense system (immune system) is weak. A condition that may cause you to breathe in fluids from your mouth and nose. You had your spleen taken out. You do not take good care of your teeth and mouth (poor dental hygiene). You use or have used tobacco products. You go where the germs that cause this illness are common. You are older than 75 years of age. What are the signs or symptoms? A cough. A fever. Sweating or chills. Chest pain, often when you breathe deeply or cough. Breathing problems, such as: Fast breathing. Trouble breathing. Shortness of breath. Feeling tired (fatigued). Muscle aches. How is this treated? Treatment for this condition depends on many things, such as: The cause of your illness. Your medicines. Your other health problems. Most adults can be treated at home. Sometimes, treatment must happen in a hospital. Treatment may include medicines to kill germs. Medicines may depend on which germ caused your illness. Very bad pneumonia is rare. If you get it, you may: Have a machine to  help you breathe. Have fluid taken away from around your lungs. Follow these instructions at home: Medicines Take over-the-counter and prescription medicines only as told by your doctor. Take cough medicine only if you are losing sleep. Cough medicine can keep your body from taking mucus away from your lungs. If you were prescribed antibiotics, take them as told by your doctor. Do not stop taking them even if you start to feel better. Lifestyle     Do not smoke or use any products that contain nicotine or tobacco. If you need help quitting, ask your doctor. Do not drink alcohol. Eat a healthy diet. This includes a lot of vegetables, fruits, whole grains, low-fat dairy products, and low-fat (lean) protein. General instructions  Rest a lot. Sleep for at least 8 hours each night. Sleep with your head and neck raised. Put a few pillows under your head or sleep in a reclining chair. Return to your normal activities as told by your doctor. Ask your doctor what activities are safe for you. Drink enough fluid to keep your pee (urine) pale yellow. If your throat is sore, gargle with a mixture of salt and water 3-4 times a day or as needed. To make salt water, completely dissolve -1 tsp (3-6 g) of salt in 1 cup (237 mL) of warm water. Keep all follow-up visits. How is this prevented? Getting the pneumonia shot (vaccine). These shots have different types and schedules. Ask your doctor what works best for you. Think about getting this shot if: You are older than 75 years of age. You are 58-61 years of age and: You are being treated  for cancer. You have long-term lung disease. You have other problems that affect your body's defense system. Ask your doctor if you have one of these. Getting your flu shot every year. Ask your doctor which type of shot is best for you. Going to the dentist as often as told. Washing your hands often with soap and water for at least 20 seconds. If you cannot use soap  and water, use hand sanitizer. Contact a doctor if: You have a fever. You lose sleep because your cough medicine does not help. Get help right away if: You are short of breath and this gets worse. You have more chest pain. Your sickness gets worse. This is very serious if: You are an older adult. Your body's defense system is weak. You cough up blood. These symptoms may be an emergency. Get help right away. Call 911. Do not wait to see if the symptoms will go away. Do not drive yourself to the hospital. Summary Pneumonia is an infection of the lungs. Community-acquired pneumonia affects people who have not been in the hospital. Certain germs can cause this infection. This condition may be treated with medicines that kill germs. For very bad pneumonia, you may need a hospital stay and treatment to help with breathing. This information is not intended to replace advice given to you by your health care provider. Make sure you discuss any questions you have with your health care provider. Document Revised: 06/09/2021 Document Reviewed: 06/09/2021 Elsevier Patient Education  2024 Elsevier Inc. Pneumococcal Conjugate Vaccine: What You Need to Know Many vaccine information statements are available in Spanish and other languages. See PromoAge.com.br. 1. Why get vaccinated? Pneumococcal conjugate vaccine can prevent pneumococcal disease. Pneumococcal disease refers to any illness caused by pneumococcal bacteria. These bacteria can cause many types of illnesses, including pneumonia, which is an infection of the lungs. Pneumococcal bacteria are one of the most common causes of pneumonia. Besides pneumonia, pneumococcal bacteria can also cause: Ear infections Sinus infections Meningitis (infection of the tissue covering the brain and spinal cord) Bacteremia (infection of the blood) Anyone can get pneumococcal disease, but children under 69 years old, people with certain medical conditions or  other risk factors, and adults 65 years or older are at the highest risk. Most pneumococcal infections are mild. However, some can result in long-term problems, such as brain damage or hearing loss. Meningitis, bacteremia, and pneumonia caused by pneumococcal disease can be fatal. 2. Pneumococcal conjugate vaccine Pneumococcal conjugate vaccine helps protect against bacteria that cause pneumococcal disease. There are three pneumococcal conjugate vaccines (PCV13, PCV15, and PCV20). The different vaccines are recommended for different people based on age and medical status. Your health care provider can help you determine which type of pneumococcal conjugate vaccine, and how many doses, you should receive. Infants and young children usually need 4 doses of pneumococcal conjugate vaccine. These doses are recommended at 2, 4, 6, and 13-81 months of age. Older children and adolescents might need pneumococcal conjugate vaccine depending on their age and medical conditions or other risk factors if they did not receive the recommended doses as infants or young children. Adults 19 through 13 years old with certain medical conditions or other risk factors who have not already received pneumococcal conjugate vaccine should receive pneumococcal conjugate vaccine. Adults 65 years or older who have not previously received pneumococcal conjugate vaccine should receive pneumococcal conjugate vaccine. Some people with certain medical conditions are also recommended to receive pneumococcal polysaccharide vaccine (a different type of pneumococcal  vaccine known as PPSV23). Some adults who have previously received a pneumococcal conjugate vaccine may be recommended to receive another pneumococcal conjugate vaccine. 3. Talk with your health care provider Tell your vaccination provider if the person getting the vaccine: Has had an allergic reaction after a previous dose of any type of pneumococcal conjugate vaccine (PCV13,  PCV15, PCV20, or an earlier pneumococcal conjugate vaccine known as PCV7), or to any vaccine containing diphtheria toxoid (for example, DTaP), or has any severe, life-threatening allergies In some cases, your health care provider may decide to postpone pneumococcal conjugate vaccination until a future visit. People with minor illnesses, such as a cold, may be vaccinated. People who are moderately or severely ill should usually wait until they recover. Your health care provider can give you more information. 4. Risks of a vaccine reaction Redness, swelling, pain, or tenderness where the shot is given, and fever, loss of appetite, fussiness (irritability), feeling tired, headache, muscle aches, joint pain, and chills can happen after pneumococcal conjugate vaccination. Young children may be at increased risk for seizures caused by fever after a pneumococcal conjugate vaccine if it is administered at the same time as inactivated influenza vaccine. Ask your health care provider for more information. People sometimes faint after medical procedures, including vaccination. Tell your provider if you feel dizzy or have vision changes or ringing in the ears. As with any medicine, there is a very remote chance of a vaccine causing a severe allergic reaction, other serious injury, or death. 5. What if there is a serious problem? An allergic reaction could occur after the vaccinated person leaves the clinic. If you see signs of a severe allergic reaction (hives, swelling of the face and throat, difficulty breathing, a fast heartbeat, dizziness, or weakness), call 9-1-1 and get the person to the nearest hospital. For other signs that concern you, call your health care provider. Adverse reactions should be reported to the Vaccine Adverse Event Reporting System (VAERS). Your health care provider will usually file this report, or you can do it yourself. Visit the VAERS website at www.vaers.LAgents.no or call 913-572-3811.  VAERS is only for reporting reactions, and VAERS staff members do not give medical advice. 6. The National Vaccine Injury Compensation Program The [employer] Vaccine Injury Compensation Program (VICP) is a federal program that was created to compensate people who may have been injured by certain vaccines. Claims regarding alleged injury or death due to vaccination have a time limit for filing, which may be as short as two years. Visit the VICP website at SpiritualWord.at or call 208-802-3390 to learn about the program and about filing a claim. 7. How can I learn more? Ask your health care provider. Call your local or state health department. Visit the website of the Food and Drug Administration (FDA) for vaccine package inserts and additional information at FinderList.no. Contact the Centers for Disease Control and Prevention (CDC): Call (414) 732-5421 (1-800-CDC-INFO) or Visit CDC's website at PicCapture.uy. Source: CDC Vaccine Information Statement (Interim) Pneumococcal Conjugate Vaccine (09/03/2021) This same material is available at FootballExhibition.com.br for no charge. This information is not intended to replace advice given to you by your health care provider. Make sure you discuss any questions you have with your health care provider. Document Revised: 07/27/2022 Document Reviewed: 05/02/2022 Elsevier Patient Education  2024 Elsevier Inc.   Influenza Vaccine Injection What is this medication? INFLUENZA VACCINE (in floo EN zuh vak SEEN) reduces the risk of the influenza (flu). It does not treat influenza. It is still possible  to get influenza after receiving this vaccine, but the symptoms may be less severe or not last as long. It works by helping your immune system learn how to fight off a future infection. This medicine may be used for other purposes; ask your health care provider or pharmacist if you have questions. COMMON BRAND NAME(S):  Afluria Quadrivalent, FLUAD Quadrivalent, Fluarix Quadrivalent, Flublok Quadrivalent, FLUCELVAX Quadrivalent, Flulaval Quadrivalent, Fluzone Quadrivalent What should I tell my care team before I take this medication? They need to know if you have any of these conditions: Bleeding disorder like hemophilia Fever or infection Guillain-Barre syndrome or other neurological problems Immune system problems Infection with the human immunodeficiency virus (HIV) or AIDS Low blood platelet counts Multiple sclerosis An unusual or allergic reaction to influenza virus vaccine, latex, other medications, foods, dyes, or preservatives. Different brands of vaccines contain different allergens. Some may contain latex or eggs. Talk to your care team about your allergies to make sure that you get the right vaccine. Pregnant or trying to get pregnant Breastfeeding How should I use this medication? This vaccine is injected into a muscle or under the skin. It is given by your care team. A copy of Vaccine Information Statements will be given before each vaccination. Be sure to read this sheet carefully each time. This sheet may change often. Talk to your care team to see which vaccines are right for you. Some vaccines should not be used in all age groups. Overdosage: If you think you have taken too much of this medicine contact a poison control center or emergency room at once. NOTE: This medicine is only for you. Do not share this medicine with others. What if I miss a dose? This does not apply. What may interact with this medication? Certain medications that lower your immune system, such as etanercept, anakinra, infliximab, adalimumab Certain medications that prevent or treat blood clots, such as warfarin Chemotherapy or radiation therapy Phenytoin Steroid medications, such as prednisone or cortisone Theophylline Vaccines This list may not describe all possible interactions. Give your health care provider a  list of all the medicines, herbs, non-prescription drugs, or dietary supplements you use. Also tell them if you smoke, drink alcohol, or use illegal drugs. Some items may interact with your medicine. What should I watch for while using this medication? Report any side effects that do not go away with your care team. Call your care team if any unusual symptoms occur within 6 weeks of receiving this vaccine. You may still catch the flu, but the illness is not usually as bad. You cannot get the flu from the vaccine. The vaccine will not protect against colds or other illnesses that may cause fever. The vaccine is needed every year. What side effects may I notice from receiving this medication? Side effects that you should report to your care team as soon as possible: Allergic reactions--skin rash, itching, hives, swelling of the face, lips, tongue, or throat Side effects that usually do not require medical attention (report these to your care team if they continue or are bothersome): Chills Fatigue Headache Joint pain Loss of appetite Muscle pain Nausea Pain, redness, or irritation at injection site This list may not describe all possible side effects. Call your doctor for medical advice about side effects. You may report side effects to FDA at 1-800-FDA-1088. Where should I keep my medication? The vaccine is only given by your care team. It will not be stored at home. NOTE: This sheet is a summary. It  may not cover all possible information. If you have questions about this medicine, talk to your doctor, pharmacist, or health care provider.  2024 Elsevier/Gold Standard (2021-09-21 00:00:00)

## 2023-03-07 NOTE — Progress Notes (Signed)
[facility]   Subjective:  [name] is a 77 y.o. John Luna male(patient's grandson is present patient has given him permission to be at his appointment and interpret for him John Luna .  Grandson initially took patient to urgent care after weight and 2-1/2 hours advised him to go to the emergency room.  He presented to urgent care with a cough, wheezing shortness of breath, abdominal pain and chest pain.  Patient presents for hospital follow up and establish care. Admit date to the hospital was 02/23/23, patient was discharged from the hospital on 02/25/23, patient was admitted for: Protein-calorie malnutrition, severe HyponatremiaHypertension, CAP (community acquired pneumonia), Severe sepsis (HCC) and cough. Explain to him CAP is contagious stated no one told him. Patient has been around the family. Today he continues to cough and throat hurts.  He has poor appetite Denies fever chills.  Translator arrived however dialect is different therefore grandson continued.  Past Medical History:  Diagnosis Date   Acute pulmonary edema (HCC)    Altered mental status    Hyperkalemia 07/09/2019   Irritant contact dermatitis 06/30/2020     No Known Allergies    Current Outpatient Medications on File Prior to Visit  Medication Sig Dispense Refill   albuterol (PROVENTIL) (2.5 MG/3ML) 0.083% nebulizer solution Take 3 mLs (2.5 mg total) by nebulization every 4 (four) hours as needed for wheezing or shortness of breath. 90 mL 1   ibuprofen (ADVIL) 600 MG tablet Take 600 mg by mouth every 6 (six) hours as needed for moderate pain (pain score 4-6) or fever.     acetaminophen (TYLENOL) 325 MG tablet Take 650 mg by mouth every 6 (six) hours as needed (pain). (Patient not taking: Reported on 03/07/2023)     azithromycin (ZITHROMAX) 500 MG tablet Take 1 tablet (500 mg total) by mouth daily. (Patient not taking: Reported on 03/07/2023) 5 tablet 0   dextromethorphan-guaiFENesin (MUCINEX DM) 30-600  MG 12hr tablet Take 1 tablet by mouth 2 (two) times daily as needed for cough. (Patient not taking: Reported on 03/07/2023)     diclofenac Sodium (VOLTAREN) 1 % GEL Apply 2 g topically 4 (four) times daily. (Patient not taking: Reported on 03/07/2023) 100 g 0   doxycycline (VIBRA-TABS) 100 MG tablet Take 1 tablet (100 mg total) by mouth every 12 (twelve) hours. (Patient not taking: Reported on 03/07/2023) 14 tablet 0   lidocaine (LIDODERM) 5 % Place 1 patch onto the skin daily. Remove & Discard patch within 12 hours or as directed by MD (Patient not taking: Reported on 03/07/2023) 30 patch 0   losartan (COZAAR) 50 MG tablet TAKE 1 TABLET(50 MG) BY MOUTH DAILY (Patient not taking: Reported on 02/23/2023) 30 tablet 0   pantoprazole (PROTONIX) 40 MG tablet Take 1 tablet (40 mg total) by mouth daily. (Patient not taking: Reported on 03/07/2023) 90 tablet 0   sodium chloride HYPERTONIC 3 % nebulizer solution Take 4 mLs by nebulization 2 (two) times daily. (Patient not taking: Reported on 03/07/2023) 750 mL 0   No current facility-administered medications on file prior to visit.     Review of System: Comprehensive ROS Pertinent positive and negative noted in HPI    Objective:  BP 137/75   Pulse 84   Resp 16   Ht 5' 1.5" (1.562 m)   Wt 91 lb 6.4 oz (41.5 kg)   SpO2 98%   BMI 16.99 kg/m   Filed Weights   03/07/23 1408  Weight: 91 lb 6.4 oz (41.5 kg)  Physical Exam: General Appearance:frail and thin , in no apparent distress. Eyes: PERRLA, EOMs, conjunctiva no swelling or erythema Sinuses: No Frontal/maxillary tenderness ENT/Mouth: Ext aud canals clear, TMs without erythema, bulging. No erythema, swelling, or Hearing normal.  Neck: Supple, thyroid normal.  Respiratory: Respiratory effort normal, BS equal bilaterally without rales, rhonchi, wheezing or stridor.  Cardio: RRR with no MRGs. Brisk peripheral pulses without edema.  Abdomen: Soft, + BS.  Non tender, no guarding, rebound,  hernias, masses. Lymphatics: Non tender without lymphadenopathy.  Musculoskeletal: Full ROM, 5/5 strength, normal gait.  Skin: Warm, dry without rashes, lesions, ecchymosis.  Neuro: Cranial nerves intact. Normal muscle tone, no cerebellar symptoms. Sensation intact.  Psych: Awake and oriented X 3, normal affect, Insight and Judgment appropriate.    Assessment:  John Luna was seen today for hospitalization follow-up.  Diagnoses and all orders for this visit:  Tobacco abuse Stopped   Alcohol abuse Continues   Hospital discharge follow-up  Encounter to establish care  Community acquired pneumonia of left lower lobe of lung -     Basic Metabolic Panel -     Cancel: Culture, sputum-assessment -     Cancel: AFB culture, blood -     Culture, fungus without smear -     Culture, sputum-assessment -     AFB culture, blood -     Fungus Culture with Smear      This note has been created with Dragon speech Office manager. Any transcriptional errors are unintentional.   Grayce Sessions, NP 03/07/2023, 2:33 PM

## 2023-03-08 LAB — BASIC METABOLIC PANEL
BUN/Creatinine Ratio: 14 (ref 10–24)
BUN: 10 mg/dL (ref 8–27)
CO2: 24 mmol/L (ref 20–29)
Calcium: 8.4 mg/dL — ABNORMAL LOW (ref 8.6–10.2)
Chloride: 100 mmol/L (ref 96–106)
Creatinine, Ser: 0.7 mg/dL — ABNORMAL LOW (ref 0.76–1.27)
Glucose: 86 mg/dL (ref 70–99)
Potassium: 4.9 mmol/L (ref 3.5–5.2)
Sodium: 133 mmol/L — ABNORMAL LOW (ref 134–144)
eGFR: 96 mL/min/{1.73_m2} (ref >59)

## 2023-06-01 ENCOUNTER — Encounter (INDEPENDENT_AMBULATORY_CARE_PROVIDER_SITE_OTHER): Payer: Self-pay | Admitting: *Deleted

## 2024-01-04 ENCOUNTER — Ambulatory Visit (INDEPENDENT_AMBULATORY_CARE_PROVIDER_SITE_OTHER): Admitting: Family Medicine

## 2024-01-04 ENCOUNTER — Encounter: Payer: Self-pay | Admitting: Family Medicine

## 2024-01-04 VITALS — BP 100/60 | HR 70 | Temp 97.6°F | Ht 61.5 in | Wt 88.0 lb

## 2024-01-04 DIAGNOSIS — R630 Anorexia: Secondary | ICD-10-CM | POA: Diagnosis not present

## 2024-01-04 DIAGNOSIS — J418 Mixed simple and mucopurulent chronic bronchitis: Secondary | ICD-10-CM | POA: Diagnosis not present

## 2024-01-04 DIAGNOSIS — F1010 Alcohol abuse, uncomplicated: Secondary | ICD-10-CM | POA: Insufficient documentation

## 2024-01-04 DIAGNOSIS — F1020 Alcohol dependence, uncomplicated: Secondary | ICD-10-CM | POA: Insufficient documentation

## 2024-01-04 DIAGNOSIS — K219 Gastro-esophageal reflux disease without esophagitis: Secondary | ICD-10-CM | POA: Insufficient documentation

## 2024-01-04 DIAGNOSIS — I5032 Chronic diastolic (congestive) heart failure: Secondary | ICD-10-CM

## 2024-01-04 DIAGNOSIS — Z7689 Persons encountering health services in other specified circumstances: Secondary | ICD-10-CM

## 2024-01-04 MED ORDER — PREDNISONE 20 MG PO TABS: 20.0000 mg | ORAL_TABLET | Freq: Two times a day (BID) | ORAL | 0 refills | Status: DC

## 2024-01-04 MED ORDER — PREDNISONE 20 MG PO TABS: 20.0000 mg | ORAL_TABLET | Freq: Every day | ORAL | 0 refills | Status: AC

## 2024-01-04 MED ORDER — MIRTAZAPINE 7.5 MG PO TABS: ORAL_TABLET | ORAL | 1 refills | Status: DC

## 2024-01-04 MED ORDER — PANTOPRAZOLE SODIUM 40 MG PO TBEC: 40.0000 mg | DELAYED_RELEASE_TABLET | Freq: Every day | ORAL | 1 refills | Status: AC

## 2024-01-04 NOTE — Assessment & Plan Note (Signed)
 COPD with chronic cough and worsening dyspnea due to non-compliance with nebulizer and lack of pulmonology follow-up. - Refer to pulmonologist for COPD management. - Prescribe Breztri inhaler twice daily. - Prescribe prednisone  20 mg once daily. - Encourage nebulizer use at home.

## 2024-01-04 NOTE — Assessment & Plan Note (Signed)
 Has stopped cardiology visits; will refer to cardiology

## 2024-01-04 NOTE — Assessment & Plan Note (Signed)
 Chronic anorexia with significant weight loss, likely exacerbated by alcohol dependence. - Prescribe Remeron  once daily at night. - Provide Ensure for nutritional support

## 2024-01-04 NOTE — Progress Notes (Signed)
 I,Jameka J Llittleton, CMA,acting as a Neurosurgeon for [employer], NP.,have documented all relevant documentation on the behalf of Bruna Creighton, NP,as directed by  Bruna Creighton, NP while in the presence of Bruna Creighton, NP.  Subjective:  Patient ID: John Luna , male    DOB: [birth year] , 78 y.o.   MRN: [id number]  Chief Complaint  Patient presents with   Establish Care   decreased appetite    Patient presents today for decreased appetite. He has been like this for at least 9 months. He only eats once a day.    Cough    He reports he has been coughing a lot, especially at night. He has a productive cough. His cough his chronic.    Rash    He is also itching all over. He has a rash all over his body.    Altered Mental Status    Patient's wife reports he is very confused she thinks he may have dementia.     HPI Discussed the use of AI scribe software for clinical note transcription with the patient, who gave verbal consent to proceed.  History of Present Illness      John Luna is a 78 year old male with congestive heart failure and COPD who presents to establish care. He is accompanied by his wife.  He typically visits the emergency room when he becomes ill. He has not seen a lung or heart specialist.  He has a significant history of smoking, having quit in 2013. He was admitted to the ER last year where some imaging was performed. He experiences chronic cough, which has been worsening over the years, and he reports significant sputum production, especially at night. He also experiences shortness of breath.  Nutritionally, he has a poor appetite, eating only one small meal a day, supplemented by small snacks. His wife reports that he drinks four to five bottles of alcohol daily, which has contributed to his lack of appetite. This poor nutritional intake has been ongoing for many years but has worsened over the past year, with a notable decline in his ability to prepare food for  himself.  He has a history of using a nebulizer, which was provided by the hospital, but he does not use it regularly. His wife manages most of his care, and there is a language barrier that may have impacted previous follow-up care. He has a daughter who is often at work and unable to assist regularly.  In terms of social history, he lives with his wife and has a daughter who works and takes care of her children. His wife struggles to drive him to appointments, and he is looking for care options close to his home on [address].  Gave patient samples of Breztri and demonstrated how to use it, referred him to a pulmonologist.  During this visit, patient had a John GERLENE FAVORITE from [employer] as his interpreter. He understands [place]  Spent about 45 minutes coordinating care     Past Medical History:  Diagnosis Date   Acute pulmonary edema (HCC)    Altered mental status    Hyperkalemia 07/09/2019   Irritant contact dermatitis 06/30/2020     History reviewed. No pertinent family history.   Current Outpatient Medications:    mirtazapine  (REMERON ) 7.5 MG tablet, Take at night daily, Disp: 90 tablet, Rfl: 1   acetaminophen  (TYLENOL ) 325 MG tablet, Take 650 mg by mouth every 6 (six) hours as needed (pain). (Patient not taking:  Reported on 01/04/2024), Disp: , Rfl:    albuterol  (PROVENTIL ) (2.5 MG/3ML) 0.083% nebulizer solution, Take 3 mLs (2.5 mg total) by nebulization every 4 (four) hours as needed for wheezing or shortness of breath. (Patient not taking: Reported on 01/04/2024), Disp: 90 mL, Rfl: 1   azithromycin  (ZITHROMAX ) 500 MG tablet, Take 1 tablet (500 mg total) by mouth daily. (Patient not taking: Reported on 01/04/2024), Disp: 5 tablet, Rfl: 0   dextromethorphan -guaiFENesin  (MUCINEX  DM) 30-600 MG 12hr tablet, Take 1 tablet by mouth 2 (two) times daily as needed for cough. (Patient not taking: Reported on 01/04/2024), Disp: , Rfl:    diclofenac  Sodium (VOLTAREN ) 1 %  GEL, Apply 2 g topically 4 (four) times daily. (Patient not taking: Reported on 01/04/2024), Disp: 100 g, Rfl: 0   doxycycline  (VIBRA -TABS) 100 MG tablet, Take 1 tablet (100 mg total) by mouth every 12 (twelve) hours. (Patient not taking: Reported on 01/04/2024), Disp: 14 tablet, Rfl: 0   ibuprofen (ADVIL) 600 MG tablet, Take 600 mg by mouth every 6 (six) hours as needed for moderate pain (pain score 4-6) or fever. (Patient not taking: Reported on 01/04/2024), Disp: , Rfl:    lidocaine  (LIDODERM ) 5 %, Place 1 patch onto the skin daily. Remove & Discard patch within 12 hours or as directed by MD (Patient not taking: Reported on 01/04/2024), Disp: 30 patch, Rfl: 0   losartan  (COZAAR ) 50 MG tablet, TAKE 1 TABLET(50 MG) BY MOUTH DAILY (Patient not taking: Reported on 01/04/2024), Disp: 30 tablet, Rfl: 0   pantoprazole  (PROTONIX ) 40 MG tablet, Take 1 tablet (40 mg total) by mouth daily., Disp: 90 tablet, Rfl: 1   predniSONE  (DELTASONE ) 20 MG tablet, Take 1 tablet (20 mg total) by mouth daily with breakfast for 10 days., Disp: 10 tablet, Rfl: 0   sodium chloride  HYPERTONIC 3 % nebulizer solution, Take 4 mLs by nebulization 2 (two) times daily. (Patient not taking: Reported on 01/04/2024), Disp: 750 mL, Rfl: 0   No Known Allergies   Review of Systems  Constitutional: Negative.   HENT: Negative.    Respiratory:  Positive for cough and shortness of breath.   Cardiovascular: Negative.   Musculoskeletal:  Positive for gait problem.  Skin: Negative.      Today's Vitals   01/04/24 0837  BP: 100/60  Pulse: 70  Temp: 97.6 F (36.4 C)  TempSrc: Oral  Weight: 88 lb (39.9 kg)  Height: 5' 1.5 (1.562 m)  PainSc: 6   PainLoc: Leg   Body mass index is 16.36 kg/m.  Wt Readings from Last 3 Encounters:  01/04/24 88 lb (39.9 kg)  03/07/23 91 lb 6.4 oz (41.5 kg)  02/23/23 91 lb 0.8 oz (41.3 kg)    The ASCVD Risk score (Arnett DK, et al., 2019) failed to calculate for the following reasons:   Cannot find a  previous HDL lab   Cannot find a previous total cholesterol lab  Objective:  Physical Exam HENT:     Head: Normocephalic.  Cardiovascular:     Rate and Rhythm: Normal rate.  Pulmonary:     Effort: Pulmonary effort is normal.     Breath sounds: Rhonchi present.  Skin:    General: Skin is dry.  Neurological:     Mental Status: He is alert.     Motor: Weakness present.         Assessment And Plan:  Encounter to establish care with new doctor  Decreased appetite Assessment & Plan: Chronic anorexia with significant weight loss,  likely exacerbated by alcohol dependence. - Prescribe Remeron  once daily at night. - Provide Ensure for nutritional support  Orders: -     Mirtazapine ; Take at night daily  Dispense: 90 tablet; Refill: 1 -     CBC with Differential/Platelet -     BMP8+eGFR  Chronic diastolic CHF (congestive heart failure) (HCC) Assessment & Plan: Has stopped cardiology visits; will refer to cardiology  Orders: -     Brain natriuretic peptide  Mixed simple and mucopurulent chronic bronchitis (HCC) Assessment & Plan: COPD with chronic cough and worsening dyspnea due to non-compliance with nebulizer and lack of pulmonology follow-up. - Refer to pulmonologist for COPD management. - Prescribe Breztri inhaler twice daily. - Prescribe prednisone  20 mg once daily. - Encourage nebulizer use at home.   Orders: -     Pulmonary Visit -     predniSONE ; Take 1 tablet (20 mg total) by mouth daily with breakfast for 10 days.  Dispense: 10 tablet; Refill: 0  Gastroesophageal reflux disease without esophagitis -     Pantoprazole  Sodium; Take 1 tablet (40 mg total) by mouth daily.  Dispense: 90 tablet; Refill: 1  Alcohol abuse Assessment & Plan: Chronic alcohol use contributing to anorexia and lack of appetite. - Advise cessation of alcohol.     Assessment & Plan Chronic obstructive pulmonary disease with chronic cough COPD with chronic cough and worsening dyspnea  due to non-compliance with nebulizer and lack of pulmonology follow-up. - Refer to pulmonologist for COPD management. - Prescribe Breztri inhaler twice daily. - Prescribe prednisone  20 mg once daily. - Encourage nebulizer use at home.  Alcohol dependence Chronic alcohol use contributing to anorexia and lack of appetite. - Advise cessation of alcohol.  Anorexia (loss of appetite) Chronic anorexia with significant weight loss, likely exacerbated by alcohol dependence. - Prescribe Remeron  once daily at night. - Provide Ensure for nutritional support.  General Health Maintenance Requires annual CT scan due to smoking history. - Schedule annual CT scan of the lungs.   Return if symptoms worsen or fail to improve, for 6 weeks follow up.  Patient was given opportunity to ask questions. Patient verbalized understanding of the plan and was able to repeat key elements of the plan. All questions were answered to their satisfaction.    I, Bruna Creighton, NP, have reviewed all documentation for this visit. The documentation on 01/04/2024 for the exam, diagnosis, procedures, and orders are all accurate and complete.   IF YOU HAVE BEEN REFERRED TO A SPECIALIST, IT MAY TAKE 1-2 WEEKS TO SCHEDULE/PROCESS THE REFERRAL. IF YOU HAVE NOT HEARD FROM US /SPECIALIST IN TWO WEEKS, PLEASE GIVE US  A CALL AT [phone].

## 2024-01-04 NOTE — Assessment & Plan Note (Signed)
 Chronic alcohol use contributing to anorexia and lack of appetite. - Advise cessation of alcohol.

## 2024-01-05 LAB — BMP8+EGFR
BUN/Creatinine Ratio: 6 — ABNORMAL LOW (ref 10–24)
BUN: 4 mg/dL — ABNORMAL LOW (ref 8–27)
CO2: 19 mmol/L — ABNORMAL LOW (ref 20–29)
Calcium: 9 mg/dL (ref 8.6–10.2)
Chloride: 87 mmol/L — ABNORMAL LOW (ref 96–106)
Creatinine, Ser: 0.68 mg/dL — ABNORMAL LOW (ref 0.76–1.27)
Glucose: 69 mg/dL — ABNORMAL LOW (ref 70–99)
Potassium: 4.9 mmol/L (ref 3.5–5.2)
Sodium: 121 mmol/L — ABNORMAL LOW (ref 134–144)
eGFR: 97 mL/min/1.73 (ref >59)

## 2024-01-05 LAB — CBC WITH DIFFERENTIAL/PLATELET
Basophils Absolute: 0.1 x10E3/uL (ref 0.0–0.2)
Basos: 1 %
EOS (ABSOLUTE): 1.3 x10E3/uL — ABNORMAL HIGH (ref 0.0–0.4)
Eos: 26 %
Hematocrit: 43 % (ref 37.5–51.0)
Hemoglobin: 13 g/dL (ref 13.0–17.7)
Immature Grans (Abs): 0 x10E3/uL (ref 0.0–0.1)
Immature Granulocytes: 0 %
Lymphocytes Absolute: 1.7 x10E3/uL (ref 0.7–3.1)
Lymphs: 34 %
MCH: 24.6 pg — ABNORMAL LOW (ref 26.6–33.0)
MCHC: 30.2 g/dL — ABNORMAL LOW (ref 31.5–35.7)
MCV: 81 fL (ref 79–97)
Monocytes Absolute: 0.5 x10E3/uL (ref 0.1–0.9)
Monocytes: 9 %
Neutrophils Absolute: 1.5 x10E3/uL (ref 1.4–7.0)
Neutrophils: 30 %
Platelets: 209 x10E3/uL (ref 150–450)
RBC: 5.28 x10E6/uL (ref 4.14–5.80)
RDW: 14.2 % (ref 11.6–15.4)
WBC: 5.1 x10E3/uL (ref 3.4–10.8)

## 2024-01-05 LAB — BRAIN NATRIURETIC PEPTIDE: BNP: 267.4 pg/mL — ABNORMAL HIGH (ref 0.0–100.0)

## 2024-01-12 ENCOUNTER — Ambulatory Visit

## 2024-01-19 ENCOUNTER — Ambulatory Visit (INDEPENDENT_AMBULATORY_CARE_PROVIDER_SITE_OTHER)

## 2024-01-19 VITALS — BP 124/80 | HR 85 | Temp 98.1°F | Ht 61.0 in | Wt 88.0 lb

## 2024-01-19 DIAGNOSIS — Z23 Encounter for immunization: Secondary | ICD-10-CM | POA: Diagnosis not present

## 2024-01-19 NOTE — Patient Instructions (Signed)

## 2024-01-19 NOTE — Progress Notes (Signed)
 Patient is in office today for a nurse visit for Flu Immunization. Patient Injection was given in the  Left deltoid. Patient tolerated injection well.

## 2024-02-15 ENCOUNTER — Encounter: Payer: Self-pay | Admitting: Family Medicine

## 2024-02-15 ENCOUNTER — Ambulatory Visit (INDEPENDENT_AMBULATORY_CARE_PROVIDER_SITE_OTHER): Admitting: Family Medicine

## 2024-02-15 VITALS — BP 100/64 | HR 78 | Temp 98.2°F | Ht 61.0 in | Wt 93.0 lb

## 2024-02-15 DIAGNOSIS — I5032 Chronic diastolic (congestive) heart failure: Secondary | ICD-10-CM | POA: Diagnosis not present

## 2024-02-15 DIAGNOSIS — G8929 Other chronic pain: Secondary | ICD-10-CM

## 2024-02-15 DIAGNOSIS — M25511 Pain in right shoulder: Secondary | ICD-10-CM | POA: Diagnosis not present

## 2024-02-15 DIAGNOSIS — R630 Anorexia: Secondary | ICD-10-CM | POA: Diagnosis not present

## 2024-02-15 DIAGNOSIS — F17200 Nicotine dependence, unspecified, uncomplicated: Secondary | ICD-10-CM

## 2024-02-15 DIAGNOSIS — J418 Mixed simple and mucopurulent chronic bronchitis: Secondary | ICD-10-CM

## 2024-02-15 MED ORDER — MIRTAZAPINE 15 MG PO TABS: 15.0000 mg | ORAL_TABLET | Freq: Every day | ORAL | 2 refills | Status: AC

## 2024-02-15 MED ORDER — LIDOCAINE 5 % EX PTCH: 1.0000 | MEDICATED_PATCH | CUTANEOUS | 0 refills | Status: AC

## 2024-02-15 MED ORDER — ACETAMINOPHEN ER 650 MG PO TBCR: 650.0000 mg | EXTENDED_RELEASE_TABLET | Freq: Three times a day (TID) | ORAL | 1 refills | Status: AC | PRN

## 2024-02-15 MED ORDER — DICLOFENAC SODIUM 1 % EX GEL: 2.0000 g | Freq: Four times a day (QID) | CUTANEOUS | 0 refills | Status: AC

## 2024-02-15 NOTE — Progress Notes (Unsigned)
 I,Jameka J Llittleton, CMA,acting as a Neurosurgeon for [employer], NP.,have documented all relevant documentation on the behalf of Bruna Creighton, NP,as directed by  Bruna Creighton, NP while in the presence of Bruna Creighton, NP.  Subjective:  Patient ID: John Luna , male    DOB: 1945-11-14 , 78 y.o.   MRN: [id number]  Chief Complaint  Patient presents with   Cough    Patient is here for a f/u on his cough. He is here with his wife, she reports his cough is doing better. Patient's wife is requesting a refill on the prednisone .    Weight Check    Patient reports he is eating a little bit better and he has still been drinking ensures.    Shoulder Pain    Patient reports he is having some pain in his shoulder. Patient reports the pain is not consistent it comes and goes and when it comes it is pretty bad. They feel it may be arthritis. The pain started 20 years ago.    HPI Discussed the use of AI scribe software for clinical note transcription with the patient, who gave verbal consent to proceed.  History of Present Illness    John Luna is a 78 year old male with a history of heart failure and COPD who presents for follow-up regarding shoulder pain and lung health management. He is accompanied by his wife.  He has been experiencing intermittent shoulder pain for over twenty years, with some days being better than others. The pain is not associated with any specific injury or trauma and sometimes radiates to his neck. He has tried Tylenol  for relief without success, but over-the-counter topical treatments like Icy Hot patches provide some relief.  He has a history of lung issues and uses an inhaler daily, which he finds helpful. He missed a pulmonology appointment earlier this month due to communication issues. He was previously prescribed medication to stimulate his appetite and improve sleep, which has been effective. He has a history of smoking and underwent a CT scan of the chest last year, he is due  for his screening for this year.  He has not yet seen a cardiologist. There is a need to update his contact information to ensure proper communication for future appointments.  He has stopped drinking alcohol, which has positively impacted his appetite. He is consuming Ensure to help with weight gain, as he has a small frame and does not eat much. His wife reports that he is gaining weight and that the medication prescribed to increase his appetite is helping.  He received his flu shot last week. Patient and his wife both speak montagnard and they had an interpreter over the phone whose name is Mr Risuin [phone]   Spent upto 50 minutes up patient     Past Medical History:  Diagnosis Date   Acute pulmonary edema (HCC)    Altered mental status    Hyperkalemia 07/09/2019   Irritant contact dermatitis 06/30/2020     History reviewed. No pertinent family history.   Current Outpatient Medications:    acetaminophen  (TYLENOL ) 650 MG CR tablet, Take 1 tablet (650 mg total) by mouth every 8 (eight) hours as needed for pain., Disp: 30 tablet, Rfl: 1   lidocaine  (LIDODERM ) 5 %, Place 1 patch onto the skin daily for 10 days. Remove & Discard patch within 12 hours or as directed by MD, Disp: 10 patch, Rfl: 0   mirtazapine  (REMERON ) 15 MG tablet, Take 1 tablet (15 mg  total) by mouth at bedtime., Disp: 90 tablet, Rfl: 2   pantoprazole  (PROTONIX ) 40 MG tablet, Take 1 tablet (40 mg total) by mouth daily., Disp: 90 tablet, Rfl: 1   albuterol  (PROVENTIL ) (2.5 MG/3ML) 0.083% nebulizer solution, Take 3 mLs (2.5 mg total) by nebulization every 4 (four) hours as needed for wheezing or shortness of breath. (Patient not taking: Reported on 01/04/2024), Disp: 90 mL, Rfl: 1   diclofenac  Sodium (VOLTAREN ) 1 % GEL, Apply 2 g topically 4 (four) times daily., Disp: 100 g, Rfl: 0   ibuprofen (ADVIL) 600 MG tablet, Take 600 mg by mouth every 6 (six) hours as needed for moderate pain (pain score 4-6) or fever. (Patient  not taking: Reported on 01/04/2024), Disp: , Rfl:    losartan  (COZAAR ) 50 MG tablet, TAKE 1 TABLET(50 MG) BY MOUTH DAILY (Patient not taking: Reported on 02/15/2024), Disp: 30 tablet, Rfl: 0   No Known Allergies   Review of Systems  Constitutional: Negative.   HENT: Negative.    Respiratory:  Positive for cough and wheezing.   Gastrointestinal: Negative.   Endocrine: Negative for polydipsia, polyphagia and polyuria.  Musculoskeletal:  Positive for arthralgias.       Right shoulder pain  Neurological: Negative.   Psychiatric/Behavioral: Negative.       Today's Vitals   02/15/24 0840  BP: 100/64  Pulse: 78  Temp: 98.2 F (36.8 C)  TempSrc: Oral  Weight: 93 lb (42.2 kg)  Height: 5' 1 (1.549 m)  PainSc: 9   PainLoc: Shoulder   Body mass index is 17.57 kg/m.  Wt Readings from Last 3 Encounters:  02/15/24 93 lb (42.2 kg)  01/19/24 88 lb (39.9 kg)  01/04/24 88 lb (39.9 kg)    The ASCVD Risk score (Arnett DK, et al., 2019) failed to calculate for the following reasons:   Cannot find a previous HDL lab   Cannot find a previous total cholesterol lab  Objective:  Physical Exam Constitutional:      Appearance: Normal appearance.  Cardiovascular:     Rate and Rhythm: Normal rate and regular rhythm.     Pulses: Normal pulses.     Heart sounds: Normal heart sounds.  Pulmonary:     Effort: Pulmonary effort is normal.     Breath sounds: Normal breath sounds.  Abdominal:     General: Bowel sounds are normal.  Musculoskeletal:        General: Tenderness present.  Skin:    General: Skin is warm.  Neurological:     Mental Status: He is alert and oriented to person, place, and time. Mental status is at baseline.  Psychiatric:        Behavior: Behavior normal.         Assessment And Plan:  Chronic diastolic CHF (congestive heart failure) (HCC) -     Ambulatory referral to Cardiology  Decreased appetite -     Mirtazapine ; Take 1 tablet (15 mg total) by mouth at bedtime.   Dispense: 90 tablet; Refill: 2 -     CMP14+EGFR  Mixed simple and mucopurulent chronic bronchitis (HCC)  Chronic right shoulder pain -     Lidocaine ; Place 1 patch onto the skin daily for 10 days. Remove & Discard patch within 12 hours or as directed by MD  Dispense: 10 patch; Refill: 0 -     Diclofenac  Sodium; Apply 2 g topically 4 (four) times daily.  Dispense: 100 g; Refill: 0 -     DG Shoulder Right; Future  Tobacco use disorder -     CT CHEST LUNG CANCER SCREENING LOW DOSE WO CONTRAST; Future  Other orders -     Acetaminophen  ER; Take 1 tablet (650 mg total) by mouth every 8 (eight) hours as needed for pain.  Dispense: 30 tablet; Refill: 1    Return in 2 months (on 04/16/2024), or if symptoms worsen or fail to improve, for physical.  Patient was given opportunity to ask questions. Patient verbalized understanding of the plan and was able to repeat key elements of the plan. All questions were answered to their satisfaction.    I, Bruna Creighton, NP, have reviewed all documentation for this visit. The documentation on 02/16/2024 for the exam, diagnosis, procedures, and orders are all accurate and complete.   IF YOU HAVE BEEN REFERRED TO A SPECIALIST, IT MAY TAKE 1-2 WEEKS TO SCHEDULE/PROCESS THE REFERRAL. IF YOU HAVE NOT HEARD FROM US /SPECIALIST IN TWO WEEKS, PLEASE GIVE US  A CALL AT [phone] X [phone].

## 2024-02-16 ENCOUNTER — Ambulatory Visit: Payer: Self-pay | Admitting: Family Medicine

## 2024-02-16 DIAGNOSIS — F17200 Nicotine dependence, unspecified, uncomplicated: Secondary | ICD-10-CM | POA: Insufficient documentation

## 2024-02-16 LAB — CMP14+EGFR
ALT: 7 IU/L (ref 0–44)
AST: 14 IU/L (ref 0–40)
Albumin: 3.9 g/dL (ref 3.8–4.8)
Alkaline Phosphatase: 113 IU/L (ref 47–123)
BUN/Creatinine Ratio: 13 (ref 10–24)
BUN: 9 mg/dL (ref 8–27)
Bilirubin Total: 0.9 mg/dL (ref 0.0–1.2)
CO2: 21 mmol/L (ref 20–29)
Calcium: 9 mg/dL (ref 8.6–10.2)
Chloride: 97 mmol/L (ref 96–106)
Creatinine, Ser: 0.67 mg/dL — ABNORMAL LOW (ref 0.76–1.27)
Globulin, Total: 3.4 g/dL (ref 1.5–4.5)
Glucose: 73 mg/dL (ref 70–99)
Potassium: 4.6 mmol/L (ref 3.5–5.2)
Sodium: 132 mmol/L — ABNORMAL LOW (ref 134–144)
Total Protein: 7.3 g/dL (ref 6.0–8.5)
eGFR: 97 mL/min/1.73 (ref >59)

## 2024-02-16 NOTE — Progress Notes (Signed)
 There is an improvement in your lab yesterday than before. Your sodium has improved, your albumin also. Please continue to eat daily. Food is good for you.    Take care,  Bruna

## 2024-05-01 ENCOUNTER — Encounter: Payer: Self-pay | Admitting: Family Medicine
